# Patient Record
Sex: Male | Born: 1980 | Race: White | Hispanic: Yes | Smoking: Former smoker
Health system: Southern US, Community
[De-identification: ages and names within clinical notes are randomized; demographics above are authoritative.]

## PROBLEM LIST (undated history)

## (undated) DIAGNOSIS — R519 Headache, unspecified: Secondary | ICD-10-CM

## (undated) DIAGNOSIS — F32A Depression, unspecified: Secondary | ICD-10-CM

## (undated) DIAGNOSIS — K219 Gastro-esophageal reflux disease without esophagitis: Secondary | ICD-10-CM

## (undated) DIAGNOSIS — R030 Elevated blood-pressure reading, without diagnosis of hypertension: Secondary | ICD-10-CM

## (undated) DIAGNOSIS — E559 Vitamin D deficiency, unspecified: Secondary | ICD-10-CM

## (undated) DIAGNOSIS — G8929 Other chronic pain: Secondary | ICD-10-CM

## (undated) DIAGNOSIS — F329 Major depressive disorder, single episode, unspecified: Secondary | ICD-10-CM

## (undated) DIAGNOSIS — R200 Anesthesia of skin: Secondary | ICD-10-CM

## (undated) DIAGNOSIS — F419 Anxiety disorder, unspecified: Secondary | ICD-10-CM

## (undated) DIAGNOSIS — E785 Hyperlipidemia, unspecified: Secondary | ICD-10-CM

## (undated) DIAGNOSIS — T7840XA Allergy, unspecified, initial encounter: Secondary | ICD-10-CM

## (undated) DIAGNOSIS — K8020 Calculus of gallbladder without cholecystitis without obstruction: Secondary | ICD-10-CM

## (undated) DIAGNOSIS — G4730 Sleep apnea, unspecified: Secondary | ICD-10-CM

## (undated) HISTORY — PX: EYE SURGERY: SHX253

## (undated) HISTORY — DX: Hyperlipidemia, unspecified: E78.5

## (undated) HISTORY — DX: Sleep apnea, unspecified: G47.30

## (undated) HISTORY — DX: Headache, unspecified: R51.9

## (undated) HISTORY — DX: Depression, unspecified: F32.A

## (undated) HISTORY — DX: Gastro-esophageal reflux disease without esophagitis: K21.9

## (undated) HISTORY — DX: Other chronic pain: G89.29

## (undated) HISTORY — PX: HERNIA REPAIR: SHX51

## (undated) HISTORY — DX: Allergy, unspecified, initial encounter: T78.40XA

## (undated) HISTORY — PX: CHOLECYSTECTOMY: SHX55

## (undated) HISTORY — DX: Anxiety disorder, unspecified: F41.9

## (undated) HISTORY — DX: Calculus of gallbladder without cholecystitis without obstruction: K80.20

---
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
## 2001-07-04 ENCOUNTER — Emergency Department (HOSPITAL_COMMUNITY): Admission: EM | Admit: 2001-07-04 | Discharge: 2001-07-04 | Payer: Self-pay | Admitting: *Deleted

## 2006-06-05 ENCOUNTER — Emergency Department (HOSPITAL_COMMUNITY): Admission: EM | Admit: 2006-06-05 | Discharge: 2006-06-05 | Payer: Self-pay | Admitting: Emergency Medicine

## 2007-04-03 ENCOUNTER — Emergency Department (HOSPITAL_COMMUNITY): Admission: EM | Admit: 2007-04-03 | Discharge: 2007-04-03 | Payer: Self-pay | Admitting: Emergency Medicine

## 2007-06-25 ENCOUNTER — Ambulatory Visit: Payer: Self-pay | Admitting: Ophthalmology

## 2007-06-25 ENCOUNTER — Emergency Department: Payer: Self-pay

## 2007-12-31 ENCOUNTER — Emergency Department (HOSPITAL_COMMUNITY): Admission: EM | Admit: 2007-12-31 | Discharge: 2007-12-31 | Payer: Self-pay | Admitting: Emergency Medicine

## 2008-02-11 ENCOUNTER — Emergency Department (HOSPITAL_COMMUNITY): Admission: EM | Admit: 2008-02-11 | Discharge: 2008-02-11 | Payer: Self-pay | Admitting: Emergency Medicine

## 2008-02-14 ENCOUNTER — Emergency Department (HOSPITAL_COMMUNITY): Admission: EM | Admit: 2008-02-14 | Discharge: 2008-02-14 | Payer: Self-pay | Admitting: Emergency Medicine

## 2008-09-24 ENCOUNTER — Emergency Department (HOSPITAL_COMMUNITY): Admission: EM | Admit: 2008-09-24 | Discharge: 2008-09-25 | Payer: Self-pay | Admitting: Emergency Medicine

## 2009-02-24 ENCOUNTER — Emergency Department (HOSPITAL_COMMUNITY): Admission: EM | Admit: 2009-02-24 | Discharge: 2009-02-25 | Payer: Self-pay | Admitting: Emergency Medicine

## 2010-04-08 IMAGING — CR DG CHEST 2V
2 series · 2 of 2 positions shown · non-contrast
Comparison: Portable chest at 3988 hours

CLINICAL DATA: Left chest pain - question abnormal density at the
left lung base on earlier portable chest

CHEST - 2 VIEW

[w chest pa]
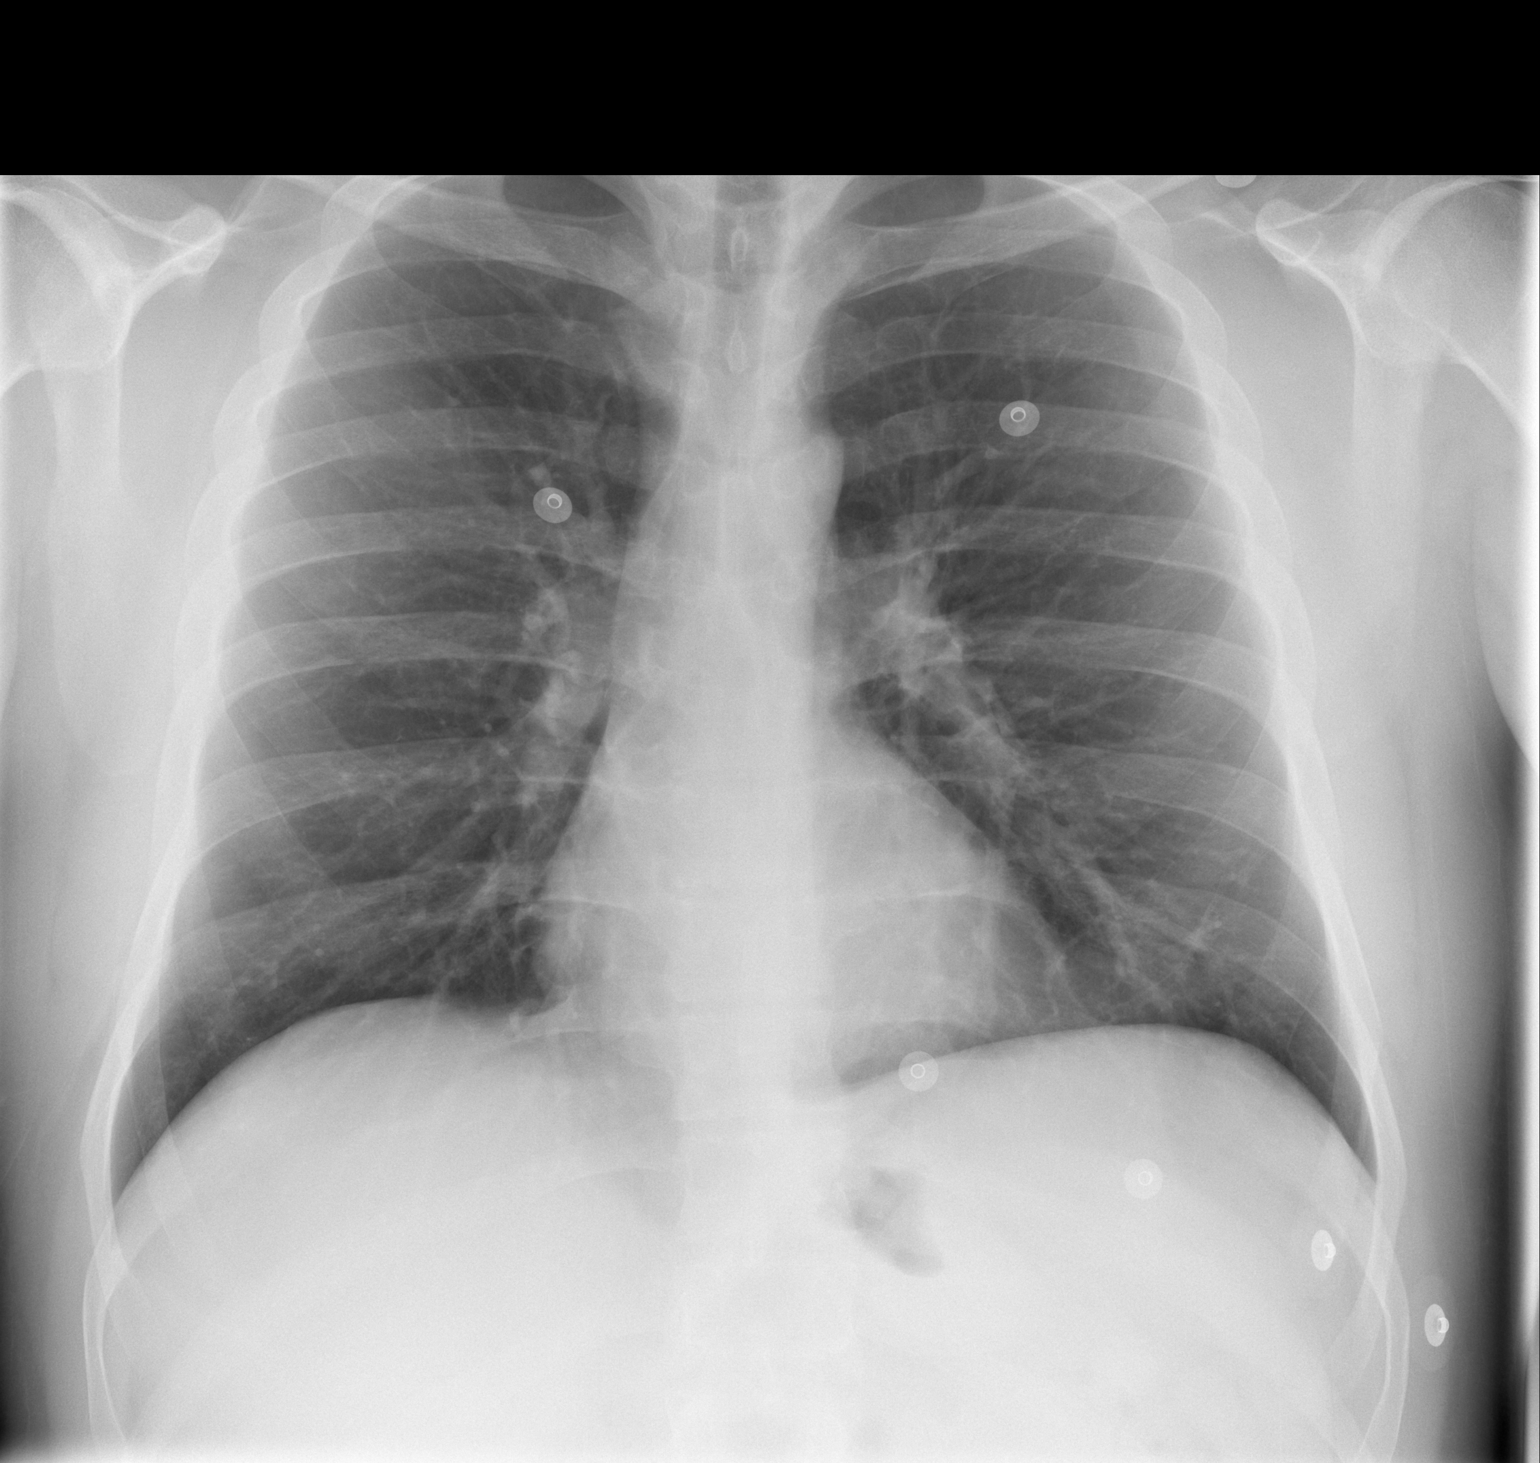

[w chest lat]
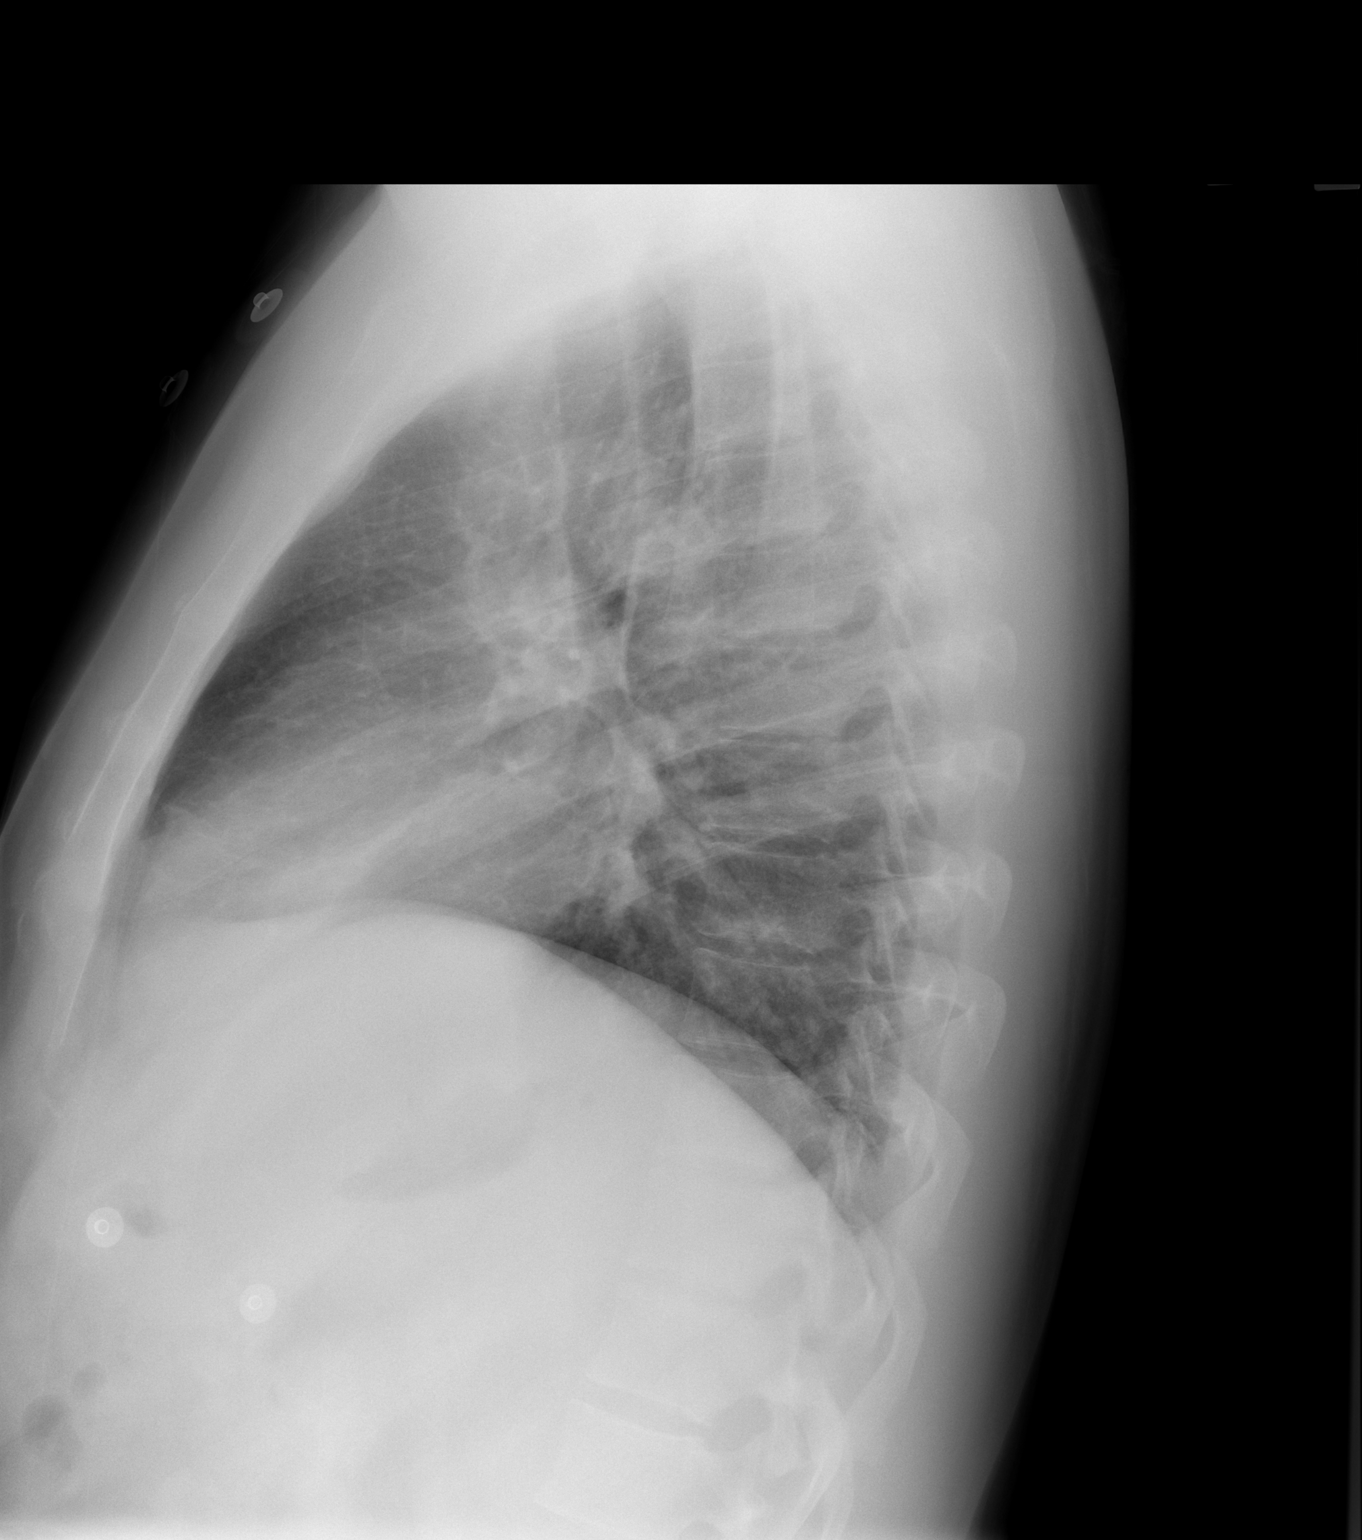

[2 of 2 positions shown; findings below may reference images not displayed]

FINDINGS: Heart and mediastinal contours normal.  Lungs clear.
Specifically no evidence for left lower lobe density as was
questioned on the earlier portable.  No pleural fluid.  Osseous
structures intact.
IMPRESSION: 1.  No left lower lobe density has questioned on the earlier
portable chest.
2.  No active disease

## 2010-04-08 IMAGING — CR DG CHEST 1V PORT
1 series · 1 of 1 positions shown · non-contrast
Comparison: None

CLINICAL DATA: Left chest pain

PORTABLE CHEST - 1 VIEW

[AP]
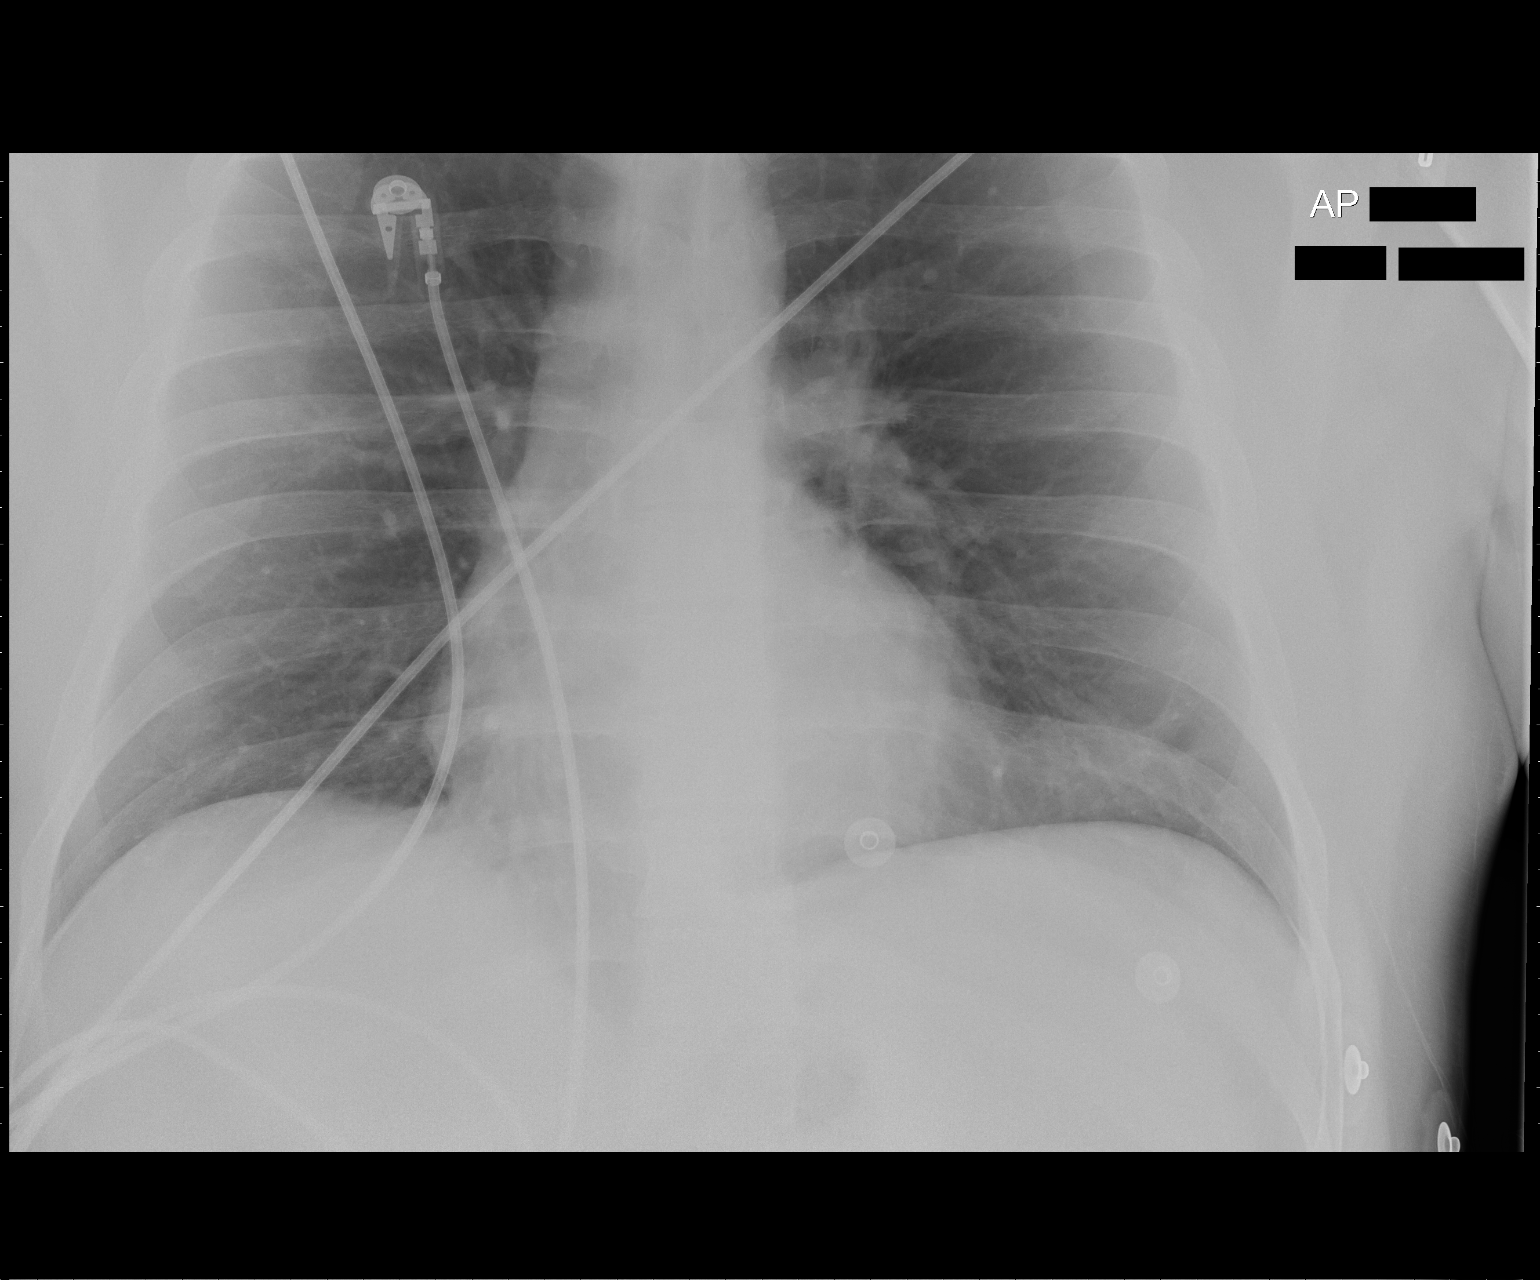

[1 of 1 positions shown; findings below may reference images not displayed]

FINDINGS: Heart vascularity normal.  Possible subtle density at the
left lung base.  Recommend PA and lateral for further assessment.
IMPRESSION: 1.  Cannot exclude a subtle left lower lobe density - recommend PA
and lateral when possible.
2.  No other acute or significant findings.

## 2010-12-06 LAB — URINALYSIS, ROUTINE W REFLEX MICROSCOPIC
Bilirubin Urine: NEGATIVE
Hgb urine dipstick: NEGATIVE
Protein, ur: NEGATIVE mg/dL
Specific Gravity, Urine: 1.017 (ref 1.005–1.030)
Urobilinogen, UA: 1 mg/dL (ref 0.0–1.0)

## 2010-12-06 LAB — DIFFERENTIAL
Basophils Absolute: 0.1 10*3/uL (ref 0.0–0.1)
Basophils Relative: 1 % (ref 0–1)
Eosinophils Relative: 1 % (ref 0–5)
Monocytes Absolute: 0.8 10*3/uL (ref 0.1–1.0)

## 2010-12-06 LAB — COMPREHENSIVE METABOLIC PANEL
ALT: 31 U/L (ref 0–53)
AST: 29 U/L (ref 0–37)
Albumin: 4.6 g/dL (ref 3.5–5.2)
Alkaline Phosphatase: 75 U/L (ref 39–117)
Chloride: 108 mEq/L (ref 96–112)
GFR calc Af Amer: >60 mL/min (ref >60)
Potassium: 3.7 mEq/L (ref 3.5–5.1)
Total Bilirubin: 0.4 mg/dL (ref 0.3–1.2)

## 2010-12-06 LAB — CBC
Platelets: 205 10*3/uL (ref 150–400)
WBC: 11.6 10*3/uL — ABNORMAL HIGH (ref 4.0–10.5)

## 2010-12-06 LAB — HEMOCCULT GUIAC POC 1CARD (OFFICE): Fecal Occult Bld: POSITIVE

## 2010-12-13 LAB — POCT I-STAT, CHEM 8
Creatinine, Ser: 0.9 mg/dL (ref 0.4–1.5)
Glucose, Bld: 127 mg/dL — ABNORMAL HIGH (ref 70–99)
Hemoglobin: 17.3 g/dL — ABNORMAL HIGH (ref 13.0–17.0)
Potassium: 3.7 mEq/L (ref 3.5–5.1)

## 2011-05-26 LAB — COMPREHENSIVE METABOLIC PANEL
BUN: 11
Calcium: 9.6
Creatinine, Ser: 1.02
Glucose, Bld: 102 — ABNORMAL HIGH
Sodium: 141
Total Protein: 7.2

## 2011-05-26 LAB — CBC
HCT: 46.2
Hemoglobin: 16
MCHC: 34.6
MCV: 96.7
RDW: 12

## 2011-05-26 LAB — DIFFERENTIAL
Lymphocytes Relative: 29
Lymphs Abs: 2.9
Monocytes Relative: 5
Neutro Abs: 6.5
Neutrophils Relative %: 64

## 2011-05-26 LAB — LIPASE, BLOOD: Lipase: 19

## 2013-12-27 ENCOUNTER — Ambulatory Visit: Payer: Self-pay | Admitting: Emergency Medicine

## 2013-12-27 VITALS — BP 126/96 | HR 72 | Temp 98.5°F | Resp 12 | Ht 66.25 in | Wt 212.0 lb

## 2013-12-27 DIAGNOSIS — M25469 Effusion, unspecified knee: Secondary | ICD-10-CM

## 2013-12-27 DIAGNOSIS — M25569 Pain in unspecified knee: Secondary | ICD-10-CM

## 2013-12-27 DIAGNOSIS — M25561 Pain in right knee: Secondary | ICD-10-CM

## 2013-12-27 DIAGNOSIS — M25461 Effusion, right knee: Secondary | ICD-10-CM

## 2013-12-27 LAB — POCT CBC
GRANULOCYTE PERCENT: 64.3 % (ref 37–80)
HEMATOCRIT: 46.6 % (ref 43.5–53.7)
HEMOGLOBIN: 15.6 g/dL (ref 14.1–18.1)
Lymph, poc: 2.9 (ref 0.6–3.4)
MCH, POC: 33.7 pg — AB (ref 27–31.2)
MCHC: 33.5 g/dL (ref 31.8–35.4)
MCV: 100.7 fL — AB (ref 80–97)
MID (cbc): 0.6 (ref 0–0.9)
MPV: 9.4 fL (ref 0–99.8)
POC GRANULOCYTE: 6.2 (ref 2–6.9)
POC LYMPH %: 29.6 % (ref 10–50)
POC MID %: 6.1 %M (ref 0–12)
Platelet Count, POC: 290 10*3/uL (ref 142–424)
RBC: 4.63 M/uL — AB (ref 4.69–6.13)
RDW, POC: 12.6 %
WBC: 9.7 10*3/uL (ref 4.6–10.2)

## 2013-12-27 LAB — URIC ACID: URIC ACID, SERUM: 3.6 mg/dL — AB (ref 4.0–7.8)

## 2013-12-27 LAB — GLUCOSE, POCT (MANUAL RESULT ENTRY): POC GLUCOSE: 78 mg/dL (ref 70–99)

## 2013-12-27 MED ORDER — CEFTRIAXONE SODIUM 1 G IJ SOLR
1.0000 g | Freq: Once | INTRAMUSCULAR | Status: AC
  Administered 2013-12-27: 1 g via INTRAMUSCULAR

## 2013-12-27 MED ORDER — DOXYCYCLINE HYCLATE 50 MG PO CAPS: 50.0000 mg | ORAL_CAPSULE | Freq: Two times a day (BID) | ORAL | Status: DC

## 2013-12-27 NOTE — Progress Notes (Signed)
   Subjective:    Patient ID: Scott Best, male    DOB: 12-07-1980, 33 y.o.   MRN: [id number]  HPI 33 year old male pt presents for right knee pain. The pain started on Sunday. He had swelling from knee to mid calf. He works in [employer]. He was putting in wood flooring on Sunday. He noticed the pain after he had been on his knees all day at work. It is red on the medial and lateral side of his right knee. It started turning red yesterday. No hx of medical problems. He had some chills last night. No hx of gout. He has had previous problems with swelling in his right knee due to a cartilage problem. The redness had gone down to his ankle, but now it is primarily around his knee.    Review of Systems     Objective:   Physical Exam there is significant redness and tenderness over the patellar tendon and just to the right of the patellar tendon. The area of redness is approximately 4 x 8". There appears to be a pointing area that is 1 x 1 cm. He does have the ability to flex extend the knee without discomfort  Results for orders placed in visit on 12/27/13  POCT CBC      Result Value Ref Range   WBC 9.7  4.6 - 10.2 K/uL   Lymph, poc 2.9  0.6 - 3.4   POC LYMPH PERCENT 29.6  10 - 50 %L   MID (cbc) 0.6  0 - 0.9   POC MID % 6.1  0 - 12 %M   POC Granulocyte 6.2  2 - 6.9   Granulocyte percent 64.3  37 - 80 %G   RBC 4.63 (*) 4.69 - 6.13 M/uL   Hemoglobin 15.6  14.1 - 18.1 g/dL   HCT, POC 40.946.6  81.143.5 - 53.7 %   MCV 100.7 (*) 80 - 97 fL   MCH, POC 33.7 (*) 27 - 31.2 pg   MCHC 33.5  31.8 - 35.4 g/dL   RDW, POC 91.412.6     Platelet Count, POC 290  142 - 424 K/uL   MPV 9.4  0 - 99.8 fL  GLUCOSE, POCT (MANUAL RESULT ENTRY)      Result Value Ref Range   POC Glucose 78  70 - 99 mg/dl        Assessment & Plan:  Patient to continue ibuprofen. He will be given 1 g of Rocephin and started on doxycycline recheck in the morning.. uric acid drawn

## 2013-12-27 NOTE — Patient Instructions (Signed)
Cellulitis Cellulitis is an infection of the skin and the tissue beneath it. The infected area is usually red and tender. Cellulitis occurs most often in the arms and lower legs.  CAUSES  Cellulitis is caused by bacteria that enter the skin through cracks or cuts in the skin. The most common types of bacteria that cause cellulitis are Staphylococcus and Streptococcus. SYMPTOMS   Redness and warmth.  Swelling.  Tenderness or pain.  Fever. DIAGNOSIS  Your caregiver can usually determine what is wrong based on a physical exam. Blood tests may also be done. TREATMENT  Treatment usually involves taking an antibiotic medicine. HOME CARE INSTRUCTIONS   Take your antibiotics as directed. Finish them even if you start to feel better.  Keep the infected arm or leg elevated to reduce swelling.  Apply a warm cloth to the affected area up to 4 times per day to relieve pain.  Only take over-the-counter or prescription medicines for pain, discomfort, or fever as directed by your caregiver.  Keep all follow-up appointments as directed by your caregiver. SEEK MEDICAL CARE IF:   You notice red streaks coming from the infected area.  Your red area gets larger or turns dark in color.  Your bone or joint underneath the infected area becomes painful after the skin has healed.  Your infection returns in the same area or another area.  You notice a swollen bump in the infected area.  You develop new symptoms. SEEK IMMEDIATE MEDICAL CARE IF:   You have a fever.  You feel very sleepy.  You develop vomiting or diarrhea.  You have a general ill feeling (malaise) with muscle aches and pains. MAKE SURE YOU:   Understand these instructions.  Will watch your condition.  Will get help right away if you are not doing well or get worse. Document Released: 05/25/2005 Document Revised: 02/14/2012 Document Reviewed: 10/31/2011 [employer] Patient Information 2014 [employer].  

## 2013-12-28 ENCOUNTER — Ambulatory Visit: Payer: Self-pay | Admitting: Emergency Medicine

## 2013-12-28 VITALS — BP 94/76 | HR 63 | Temp 97.8°F | Resp 16 | Ht 67.75 in | Wt 215.0 lb

## 2013-12-28 DIAGNOSIS — L089 Local infection of the skin and subcutaneous tissue, unspecified: Secondary | ICD-10-CM

## 2013-12-28 MED ORDER — CEFTRIAXONE SODIUM 1 G IJ SOLR
1.0000 g | Freq: Once | INTRAMUSCULAR | Status: AC
Start: 2013-12-28 — End: 2013-12-28
  Administered 2013-12-28: 1 g via INTRAMUSCULAR

## 2013-12-28 MED ORDER — DOXYCYCLINE HYCLATE 50 MG PO CAPS: 50.0000 mg | ORAL_CAPSULE | Freq: Two times a day (BID) | ORAL | Status: DC

## 2013-12-28 NOTE — Progress Notes (Signed)
   Subjective:    Patient ID: Scott Best, male    DOB: [birth year], 33 y.o.   MRN: [id number]  HPI [id number] year old male pt presents for re-check of right knee. The pain has gone down. The redness has faded from yesterday. No chills or fever. It is not as warm as yesterday.    Review of Systems     Objective:   Physical Exam there is decreased redness over the lateral right knee. There is one focal area 2 x 2 centimeters which is extremely tender.        Assessment & Plan:  Patient has a cellulitis bursitis over the lateral right knee. He does seem improved after a shot of Rocephin and taking doxycycline. Will give another 1 g of Rocephin.

## 2013-12-28 NOTE — Patient Instructions (Signed)
Cellulitis Cellulitis is an infection of the skin and the tissue beneath it. The infected area is usually red and tender. Cellulitis occurs most often in the arms and lower legs.  CAUSES  Cellulitis is caused by bacteria that enter the skin through cracks or cuts in the skin. The most common types of bacteria that cause cellulitis are Staphylococcus and Streptococcus. SYMPTOMS   Redness and warmth.  Swelling.  Tenderness or pain.  Fever. DIAGNOSIS  Your caregiver can usually determine what is wrong based on a physical exam. Blood tests may also be done. TREATMENT  Treatment usually involves taking an antibiotic medicine. HOME CARE INSTRUCTIONS   Take your antibiotics as directed. Finish them even if you start to feel better.  Keep the infected arm or leg elevated to reduce swelling.  Apply a warm cloth to the affected area up to 4 times per day to relieve pain.  Only take over-the-counter or prescription medicines for pain, discomfort, or fever as directed by your caregiver.  Keep all follow-up appointments as directed by your caregiver. SEEK MEDICAL CARE IF:   You notice red streaks coming from the infected area.  Your red area gets larger or turns dark in color.  Your bone or joint underneath the infected area becomes painful after the skin has healed.  Your infection returns in the same area or another area.  You notice a swollen bump in the infected area.  You develop new symptoms. SEEK IMMEDIATE MEDICAL CARE IF:   You have a fever.  You feel very sleepy.  You develop vomiting or diarrhea.  You have a general ill feeling (malaise) with muscle aches and pains. MAKE SURE YOU:   Understand these instructions.  Will watch your condition.  Will get help right away if you are not doing well or get worse. Document Released: 05/25/2005 Document Revised: 02/14/2012 Document Reviewed: 10/31/2011 [employer] Patient Information 2014 [employer].  

## 2014-01-01 ENCOUNTER — Telehealth: Payer: Self-pay

## 2014-01-01 NOTE — Telephone Encounter (Signed)
MS. JARRETT STATES PT HAD A WOUND AND THEY JUST WANT TO SPEAK WITH SOMEONE ABOUT THE WAY IT IS HEALING PLEASE CALL [phone]

## 2014-01-01 NOTE — Telephone Encounter (Signed)
Spoke with Ms. Scott Best, she says that the wound is looking much better. She was worried about one area that was redder earlier but looks ok now. Gave precautions to RTC if ir got bigger/redder/hotter or started having pus again. She understood

## 2014-01-26 ENCOUNTER — Telehealth: Payer: Self-pay | Admitting: Family Medicine

## 2014-01-26 ENCOUNTER — Ambulatory Visit: Payer: Self-pay | Admitting: Family Medicine

## 2014-01-26 VITALS — BP 128/88 | HR 87 | Temp 98.3°F | Resp 14 | Ht 67.75 in | Wt 217.4 lb

## 2014-01-26 DIAGNOSIS — R21 Rash and other nonspecific skin eruption: Secondary | ICD-10-CM

## 2014-01-26 MED ORDER — PREDNISONE 20 MG PO TABS: ORAL_TABLET | ORAL | Status: DC

## 2014-01-26 MED ORDER — DOXYCYCLINE HYCLATE 100 MG PO TABS: 100.0000 mg | ORAL_TABLET | Freq: Two times a day (BID) | ORAL | Status: DC

## 2014-01-26 NOTE — Progress Notes (Signed)
Chief Complaint:  Chief Complaint  Patient presents with  . Rash    rash all over body x 3-4 days. pt reports having taken allegra and putting hydrocortisone cream on it. Suspects poison ivy    HPI: Scott Best is a 33 y.o. male who is here for  3-4 day history of diffuse rash on bilateral arms primarily and some spots on abdomen and chest and also legs and groins. He does not have any rashes on hands. He has itching during the daytime,  in the afternoon the worst. He has tried Careers adviser and otc hyrdocortisone cream. He did use a new soap once about 1  week ago, has not had any new travels or food. He did have cellulitis on 5/1 and was put on 10 days of doxycycline for cellulitis and tolerated it well. He denies fevers or chills. He does have a dog, deneis ticks/bedbugs, scabies. 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   Past Medical History  Diagnosis Date  . Allergy   . Anxiety    Past Surgical History  Procedure Laterality Date  . Cholecystectomy    . Hernia repair    . Eye surgery      R eye repair nail injury   History   Social History  . Marital Status: Single    Spouse Name: N/A    Number of Children: N/A  . Years of Education: N/A   Social History Main Topics  . Smoking status: Former Games developer  . Smokeless tobacco: None  . Alcohol Use: 2.4 oz/week    4 Cans of beer per week  . Drug Use: No  . Sexual Activity: None   Other Topics Concern  . None   Social History Narrative  . None   Family History  Problem Relation Age of Onset  . Diabetes Mother   . Heart disease Father   . Cancer Maternal Grandfather   . Cancer Paternal Grandmother    No Known Allergies Prior to Admission medications   Medication Sig Start Date End Date Taking? Authorizing Provider  fexofenadine (ALLEGRA) 60 MG tablet Take 60 mg by mouth daily.   Yes Historical Provider, MD  omeprazole (PRILOSEC) 20 MG capsule Take 20 mg by mouth daily.   Yes  Historical Provider, MD  doxycycline (VIBRAMYCIN) 50 MG capsule Take 1 capsule (50 mg total) by mouth 2 (two) times daily. 12/28/13   Collene Gobble, MD     ROS: The patient denies fevers, chills, night sweats, unintentional weight loss, chest pain, palpitations, wheezing, dyspnea on exertion, nausea, vomiting, abdominal pain, dysuria, hematuria, melena, numbness, weakness, or tingling.   All other systems have been reviewed and were otherwise negative with the exception of those mentioned in the HPI and as above.    PHYSICAL EXAM: Filed Vitals:   01/26/14 1125  BP: 128/88  Pulse: 87  Temp: 98.3 F (36.8 C)  Resp: 14   Filed Vitals:   01/26/14 1125  Height: 5' 7.75" (1.721 m)  Weight: 217 lb 6.4 oz (98.612 kg)   Body mass index is 33.29 kg/(m^2).  General: Alert, no acute distress HEENT:  Normocephalic, atraumatic, oropharynx patent. EOMI, PERRLA Cardiovascular:  Regular rate and rhythm, no rubs murmurs or gallops.  No Carotid bruits, radial pulse intact. No pedal edema.  Respiratory: Clear to auscultation bilaterally.  No wheezes, rales, or rhonchi.  No cyanosis, no use of accessory musculature GI: No organomegaly, abdomen is soft  and non-tender, positive bowel sounds.  No masses. Skin: + rashes. IT appears to be 2 diff types of rash. He has a contact dermatitis rash  Maculopapular with some excoriated papules on bialteral arms, abd , groin area. THere are rows of bites  In 1-2 areas.  THen he has a healing cellulitic area on left out ankle, sital lower extremity.  He deos not have any lesions on hands or inside webspaces.  Neurologic: Facial musculature symmetric. Psychiatric: Patient is appropriate throughout our interaction. Lymphatic: No cervical lymphadenopathy Musculoskeletal: Gait intact.   LABS: Results for orders placed in visit on 12/27/13  URIC ACID      Result Value Ref Range   Uric Acid, Serum 3.6 (*) 4.0 - 7.8 mg/dL  POCT CBC      Result Value Ref Range   WBC  9.7  4.6 - 10.2 K/uL   Lymph, poc 2.9  0.6 - 3.4   POC LYMPH PERCENT 29.6  10 - 50 %L   MID (cbc) 0.6  0 - 0.9   POC MID % 6.1  0 - 12 %M   POC Granulocyte 6.2  2 - 6.9   Granulocyte percent 64.3  37 - 80 %G   RBC 4.63 (*) 4.69 - 6.13 M/uL   Hemoglobin 15.6  14.1 - 18.1 g/dL   HCT, POC 60.446.6  54.043.5 - 53.7 %   MCV 100.7 (*) 80 - 97 fL   MCH, POC 33.7 (*) 27 - 31.2 pg   MCHC 33.5  31.8 - 35.4 g/dL   RDW, POC 98.112.6     Platelet Count, POC 290  142 - 424 K/uL   MPV 9.4  0 - 99.8 fL  GLUCOSE, POCT (MANUAL RESULT ENTRY)      Result Value Ref Range   POC Glucose 78  70 - 99 mg/dl     EKG/XRAY:   [facility]   ASSESSMENT/PLAN: Encounter Diagnosis  Name Primary?  . Rash and nonspecific skin eruption Yes   Contact dermatitis  ?  Advise to check dog for ticks and also bed bugs Rx prednisone, cw allegra or zyrtec daily Soap and water for wound care Rx doxy prn if excoriated areas where he scratched start opening then he can take it, but hold for now.   Gross sideeffects, risk and benefits, and alternatives of medications d/w patient. Patient is aware that all medications have potential sideeffects and we are unable to predict every sideeffect or drug-drug interaction that may occur.  Lenell Antuhao P Le, DO 01/26/2014 2:01 PM

## 2014-01-27 NOTE — Telephone Encounter (Signed)
error 

## 2014-02-04 ENCOUNTER — Telehealth: Payer: Self-pay

## 2014-02-04 NOTE — Telephone Encounter (Signed)
Girlfriend is not on HIPPA. Spoke to pt- he did not take the final 3 doses of prednisone. He didn't feel well those days but is fine today. Pt reports his rash is cleared up. Advised pt to RTC if he needs anything. Pt was talkative, polite, alert and oriented. No signs that pt was in any state of rage. Asked pt if he felt that he was upset or depressed. He states he is doing ok. Pt states he is not in danger of hurting himself or anyone else.

## 2014-02-04 NOTE — Telephone Encounter (Signed)
Patients girlfriend called stating that Abhijay suddenly stopped taking prednisone. She states that patient is now in a rage and feels that it is a reaction to the sudden stop of the medication.  Advised to have patient come in to be reevaluated and she declined.

## 2014-08-11 ENCOUNTER — Ambulatory Visit (INDEPENDENT_AMBULATORY_CARE_PROVIDER_SITE_OTHER): Payer: Self-pay | Admitting: Urgent Care

## 2014-08-11 VITALS — BP 126/82 | HR 88 | Temp 98.9°F | Resp 18 | Ht 67.75 in | Wt 220.2 lb

## 2014-08-11 DIAGNOSIS — J988 Other specified respiratory disorders: Secondary | ICD-10-CM

## 2014-08-11 DIAGNOSIS — J029 Acute pharyngitis, unspecified: Secondary | ICD-10-CM

## 2014-08-11 DIAGNOSIS — R0981 Nasal congestion: Secondary | ICD-10-CM

## 2014-08-11 LAB — POCT RAPID STREP A (OFFICE): RAPID STREP A SCREEN: NEGATIVE

## 2014-08-11 MED ORDER — FLUTICASONE PROPIONATE 50 MCG/ACT NA SUSP: 2.0000 | Freq: Every day | NASAL | Status: DC

## 2014-08-11 MED ORDER — HYDROCODONE-HOMATROPINE 5-1.5 MG/5ML PO SYRP: 5.0000 mL | ORAL_SOLUTION | Freq: Every evening | ORAL | Status: DC | PRN

## 2014-08-11 MED ORDER — GUAIFENESIN ER 1200 MG PO TB12: 1.0000 | ORAL_TABLET | Freq: Two times a day (BID) | ORAL | Status: DC | PRN

## 2014-08-11 MED ORDER — IPRATROPIUM BROMIDE 0.03 % NA SOLN: 2.0000 | Freq: Two times a day (BID) | NASAL | Status: DC

## 2014-08-11 NOTE — Patient Instructions (Signed)
- You may use 3-4 ibuprofen over-the-counter for your sore throat and myalgias. - Once your symptoms are under control, please stop the Atrovent nasal spray (you may resume it as needed), but continue the Flonase nasal spray (at least for the duration of your allergy season).  - For appropriate administration of nasal spray, clear the nose, use opposite hand for opposite nare, sniff gently, exhale through your mouth and remember to take these two sprays at least 30 minutes apart. - Drink at least 64 ounces of water each day. - Remove as many irritants/allergies as you are able to and wear a mask while working with insulation or around other respiratory irritants.    Upper Respiratory Infection, Adult An upper respiratory infection (URI) is also sometimes known as the common cold. The upper respiratory tract includes the nose, sinuses, throat, trachea, and bronchi. Bronchi are the airways leading to the lungs. Most people improve within 1 week, but symptoms can last up to 2 weeks. A residual cough may last even longer.  CAUSES Many different viruses can infect the tissues lining the upper respiratory tract. The tissues become irritated and inflamed and often become very moist. Mucus production is also common. A cold is contagious. You can easily spread the virus to others by oral contact. This includes kissing, sharing a glass, coughing, or sneezing. Touching your mouth or nose and then touching a surface, which is then touched by another person, can also spread the virus. SYMPTOMS  Symptoms typically develop 1 to 3 days after you come in contact with a cold virus. Symptoms vary from person to person. They may include:  Runny nose.  Sneezing.  Nasal congestion.  Sinus irritation.  Sore throat.  Loss of voice (laryngitis).  Cough.  Fatigue.  Muscle aches.  Loss of appetite.  Headache.  Low-grade fever. DIAGNOSIS  You might diagnose your own cold based on familiar symptoms, since  most people get a cold 2 to 3 times a year. Your caregiver can confirm this based on your exam. Most importantly, your caregiver can check that your symptoms are not due to another disease such as strep throat, sinusitis, pneumonia, asthma, or epiglottitis. Blood tests, throat tests, and X-rays are not necessary to diagnose a common cold, but they may sometimes be helpful in excluding other more serious diseases. Your caregiver will decide if any further tests are required. RISKS AND COMPLICATIONS  You may be at risk for a more severe case of the common cold if you smoke cigarettes, have chronic heart disease (such as heart failure) or lung disease (such as asthma), or if you have a weakened immune system. The very young and very old are also at risk for more serious infections. Bacterial sinusitis, middle ear infections, and bacterial pneumonia can complicate the common cold. The common cold can worsen asthma and chronic obstructive pulmonary disease (COPD). Sometimes, these complications can require emergency medical care and may be life-threatening. PREVENTION  The best way to protect against getting a cold is to practice good hygiene. Avoid oral or hand contact with people with cold symptoms. Wash your hands often if contact occurs. There is no clear evidence that vitamin C, vitamin E, echinacea, or exercise reduces the chance of developing a cold. However, it is always recommended to get plenty of rest and practice good nutrition. TREATMENT  Treatment is directed at relieving symptoms. There is no cure. Antibiotics are not effective, because the infection is caused by a virus, not by bacteria. Treatment may include:  Increased fluid intake. Sports drinks offer valuable electrolytes, sugars, and fluids.  Breathing heated mist or steam (vaporizer or shower).  Eating chicken soup or other clear broths, and maintaining good nutrition.  Getting plenty of rest.  Using gargles or lozenges for  comfort.  Controlling fevers with ibuprofen or acetaminophen as directed by your caregiver.  Increasing usage of your inhaler if you have asthma. Zinc gel and zinc lozenges, taken in the first 24 hours of the common cold, can shorten the duration and lessen the severity of symptoms. Pain medicines may help with fever, muscle aches, and throat pain. A variety of non-prescription medicines are available to treat congestion and runny nose. Your caregiver can make recommendations and may suggest nasal or lung inhalers for other symptoms.  HOME CARE INSTRUCTIONS   Only take over-the-counter or prescription medicines for pain, discomfort, or fever as directed by your caregiver.  Use a warm mist humidifier or inhale steam from a shower to increase air moisture. This may keep secretions moist and make it easier to breathe.  Drink enough water and fluids to keep your urine clear or pale yellow.  Rest as needed.  Return to work when your temperature has returned to normal or as your caregiver advises. You may need to stay home longer to avoid infecting others. You can also use a face mask and careful hand washing to prevent spread of the virus. SEEK MEDICAL CARE IF:   After the first few days, you feel you are getting worse rather than better.  You need your caregiver's advice about medicines to control symptoms.  You develop chills, worsening shortness of breath, or brown or red sputum. These may be signs of pneumonia.  You develop yellow or brown nasal discharge or pain in the face, especially when you bend forward. These may be signs of sinusitis.  You develop a fever, swollen neck glands, pain with swallowing, or white areas in the back of your throat. These may be signs of strep throat. SEEK IMMEDIATE MEDICAL CARE IF:   You have a fever.  You develop severe or persistent headache, ear pain, sinus pain, or chest pain.  You develop wheezing, a prolonged cough, cough up blood, or have a  change in your usual mucus (if you have chronic lung disease).  You develop sore muscles or a stiff neck. Document Released: 02/08/2001 Document Revised: 11/07/2011 Document Reviewed: 11/20/2013 [facility] Patient Information 2015 [place], [place]. This information is not intended to replace advice given to you by your health care provider. Make sure you discuss any questions you have with your health care provider.

## 2014-08-11 NOTE — Progress Notes (Signed)
MRN: [id number] DOB: 1980-11-05  Subjective:   Scott Best is a 33 y.o. male presenting for 3 day history of sore throat, myalgias. Patient has also had difficulty swallowing, congestion, sinus headache, fatigue, upset stomach. Had mild ear fullness and felt feverish yesterday, started having mild cough today. He has used XL-3 x3 over the weekend without any relief. He denies having fevers, chills, n/v, diarrhea, itchy watery or red eyes, ear drainage, ear pain, chest pain, chest tightness, wheezing, shob. Admits history of seasonal allergies, not currently taking any medications. Denies history of asthma. Denies smoking, ~4 beers per week. Denies any other aggravating or relieving factors, no other questions or concerns.  Scott Best has a current medication list which includes the following prescription(s): omeprazole.  He has No Known Allergies.  Scott Best  has a past medical history of Allergy and Anxiety. Also  has past surgical history that includes Cholecystectomy; Hernia repair; and Eye surgery.  ROS As in subjective.  Objective:   Vitals: BP 126/82 mmHg  Pulse 88  Temp(Src) 98.9 F (37.2 C) (Oral)  Resp 18  Ht 5' 7.75" (1.721 m)  Wt 220 lb 3.2 oz (99.882 kg)  BMI 33.72 kg/m2  SpO2 97%  Physical Exam  Constitutional: He is oriented to person, place, and time and well-developed, well-nourished, and in no distress. No distress.  HENT:  Nasal turbinates edematous, clear-yellow rhinorrhea. TM's flat bilaterally but intact, non-erythematous, no drainage. Oropharynx clear and moist, mild-moderate tonsillar edema.  Eyes: Conjunctivae and EOM are normal. Pupils are equal, round, and reactive to light. Right eye exhibits no discharge. Left eye exhibits no discharge. No scleral icterus.  Neck: Normal range of motion. Neck supple. No thyromegaly present.  Cardiovascular: Normal rate, regular rhythm, normal heart sounds and intact distal pulses.  Exam reveals no gallop and no  friction rub.   No murmur heard. Pulmonary/Chest: Effort normal and breath sounds normal. No respiratory distress. He has no wheezes. He has no rales. He exhibits no tenderness.  Abdominal: Soft. Bowel sounds are normal. He exhibits no distension and no mass. There is no tenderness.  No hepatosplenomegaly.  Lymphadenopathy:    He has cervical adenopathy (anterior).  Neurological: He is alert and oriented to person, place, and time.  Skin: Skin is warm and dry. No rash noted. He is not diaphoretic. No erythema.  Psychiatric: Mood and affect normal.   Results for orders placed or performed in visit on 08/11/14 (from the past 24 hour(s))  POCT rapid strep A     Status: None   Collection Time: 08/11/14  9:51 AM  Result Value Ref Range   Rapid Strep A Screen Negative Negative   Assessment and Plan :   1. Respiratory infection 2. Sore throat - Recommended supportive care. Patient to return to clinic if no improvement or worsening symptoms by the end of the week - POCT rapid strep A - Throat culture Scott Best) pending - Will try ibuprofen otc first, then fill Hycodan syrup if no relief  3. Nasal congestion - Guaifenesin (MUCINEX MAXIMUM STRENGTH) 1200 MG TB12; Take 1 tablet (1,200 mg total) by mouth every 12 (twelve) hours as needed.  Dispense: 14 tablet; Refill: 1 - ipratropium (ATROVENT) 0.03 % nasal spray; Place 2 sprays into both nostrils 2 (two) times daily.  Dispense: 30 mL; Refill: 0 - fluticasone (FLONASE) 50 MCG/ACT nasal spray; Place 2 sprays into both nostrils daily.  Dispense: 16 g; Refill: 12   Scott Bamberg, PA-C [facility]  [facility] [phone] 08/11/2014 10:00 AM

## 2014-08-13 ENCOUNTER — Encounter: Payer: Self-pay | Admitting: Urgent Care

## 2014-08-13 LAB — CULTURE, GROUP A STREP: Organism ID, Bacteria: NORMAL

## 2015-03-04 ENCOUNTER — Ambulatory Visit (INDEPENDENT_AMBULATORY_CARE_PROVIDER_SITE_OTHER): Payer: Self-pay | Admitting: Family Medicine

## 2015-03-04 ENCOUNTER — Ambulatory Visit (INDEPENDENT_AMBULATORY_CARE_PROVIDER_SITE_OTHER): Payer: Self-pay

## 2015-03-04 ENCOUNTER — Other Ambulatory Visit: Payer: Self-pay | Admitting: Physician Assistant

## 2015-03-04 VITALS — BP 110/80 | HR 75 | Temp 98.2°F | Resp 18 | Ht 67.75 in | Wt 208.6 lb

## 2015-03-04 DIAGNOSIS — M25562 Pain in left knee: Secondary | ICD-10-CM

## 2015-03-04 DIAGNOSIS — R21 Rash and other nonspecific skin eruption: Secondary | ICD-10-CM

## 2015-03-04 MED ORDER — CEPHALEXIN 500 MG PO CAPS: 500.0000 mg | ORAL_CAPSULE | Freq: Four times a day (QID) | ORAL | Status: DC

## 2015-03-04 NOTE — Patient Instructions (Addendum)
Please follow up with Dr. Clelia CroftShaw at [address] [place] at 5 pm in 48 hours.  If you become worse then return to clinic or go to the hospital.    Cellulitis Cellulitis is an infection of the skin and the tissue beneath it. The infected area is usually red and tender. Cellulitis occurs most often in the arms and lower legs.  CAUSES  Cellulitis is caused by bacteria that enter the skin through cracks or cuts in the skin. The most common types of bacteria that cause cellulitis are staphylococci and streptococci. SIGNS AND SYMPTOMS   Redness and warmth.  Swelling.  Tenderness or pain.  Fever. DIAGNOSIS  Your health care provider can usually determine what is wrong based on a physical exam. Blood tests may also be done. TREATMENT  Treatment usually involves taking an antibiotic medicine. HOME CARE INSTRUCTIONS   Take your antibiotic medicine as directed by your health care provider. Finish the antibiotic even if you start to feel better.  Keep the infected arm or leg elevated to reduce swelling.  Apply a warm cloth to the affected area up to 4 times per day to relieve pain.  Take medicines only as directed by your health care provider.  Keep all follow-up visits as directed by your health care provider. SEEK MEDICAL CARE IF:   You notice red streaks coming from the infected area.  Your red area gets larger or turns dark in color.  Your bone or joint underneath the infected area becomes painful after the skin has healed.  Your infection returns in the same area or another area.  You notice a swollen bump in the infected area.  You develop new symptoms.  You have a fever. SEEK IMMEDIATE MEDICAL CARE IF:   You feel very sleepy.  You develop vomiting or diarrhea.  You have a general ill feeling (malaise) with muscle aches and pains. MAKE SURE YOU:   Understand these instructions.  Will watch your condition.  Will get help right away if you are not doing well or get  worse. Document Released: 05/25/2005 Document Revised: 12/30/2013 Document Reviewed: 10/31/2011 [facility] Patient Information 2015 [place]Care, [place]. This information is not intended to replace advice given to you by your health care provider. Make sure you discuss any questions you have with your health care provider.

## 2015-03-04 NOTE — Progress Notes (Signed)
03/04/2015 at 8:35 PM  Scott Best / DOB: [birth year] / MRN: [id number]  The patient  does not have a problem list on file.  SUBJECTIVE  Chief complaint: Rash  Patient here for left anterolateral leg pain just superior to the fibula.  Describes the pain as a throbbing and severely tender. He denies joint pain.  Denies any injury to the sight. He has had cellulitis in the past and reports his symptoms are similar.  Denies a history of gout.   He  has a past medical history of Allergy and Anxiety.    Medications reviewed and updated by myself where necessary, and exist elsewhere in the encounter.   Scott Best has No Known Allergies. He  reports that he has quit smoking. He does not have any smokeless tobacco history on file. He reports that he drinks about 2.4 oz of alcohol per week. He reports that he does not use illicit drugs. He  has no sexual activity history on file. The patient  has past surgical history that includes Cholecystectomy; Hernia repair; and Eye surgery.  His family history includes Cancer in his maternal grandfather and paternal grandmother; Diabetes in his mother; Heart disease in his father.  Review of Systems  Constitutional: Negative for fever and chills.  Respiratory: Negative for wheezing.   Cardiovascular: Negative for chest pain.  Gastrointestinal: Negative for nausea and vomiting.  Genitourinary: Negative for dysuria.  Musculoskeletal: Negative for myalgias.  Skin: Positive for rash. Negative for itching.  Neurological: Negative for dizziness and headaches.    OBJECTIVE  His  height is 5' 7.75" (1.721 m) and weight is 208 lb 9.6 oz (94.62 kg). His oral temperature is 98.2 F (36.8 C). His blood pressure is 110/80 and his pulse is 75. His respiration is 18 and oxygen saturation is 99%.  The patient's body mass index is 31.95 kg/(m^2).  Physical Exam  Vitals reviewed. Constitutional: He is oriented to person, place, and time.    Cardiovascular: Normal rate and regular rhythm.   Respiratory: Breath sounds normal.  Neurological: He is alert and oriented to person, place, and time.  Skin: Skin is warm.     Psychiatric: He has a normal mood and affect.   [facility] reading (PRIMARY) by  Dr. Clelia Croft: Negative for osseous abnormality.  The soft tissue is unremarkable.   No results found for this or any previous visit (from the past 24 hour(s)).  ASSESSMENT & PLAN  Scott Best was seen today for rash.  Diagnoses and all orders for this visit:  Rash and nonspecific skin eruption: Awaiting CBC and sed rate, however this appears to be an early cellulitis. Will cover with Keflex. He is to follow up with Dr. Clelia Croft in 48 hours.  Orders: -     CBC with Differential/Platelet  Knee pain, left Orders: -     DG Knee Complete 4 Views Left; Future    The patient was advised to call or come back to clinic if he does not see an improvement in symptoms, or worsens with the above plan.   Deliah Boston, MHS, PA-C [facility] 03/04/2015 8:35 PM

## 2015-03-05 ENCOUNTER — Ambulatory Visit (INDEPENDENT_AMBULATORY_CARE_PROVIDER_SITE_OTHER): Payer: Self-pay | Admitting: Emergency Medicine

## 2015-03-05 VITALS — BP 130/90 | HR 87 | Temp 99.3°F | Resp 18 | Ht 67.0 in | Wt 215.4 lb

## 2015-03-05 DIAGNOSIS — L03116 Cellulitis of left lower limb: Secondary | ICD-10-CM

## 2015-03-05 LAB — CBC WITH DIFFERENTIAL/PLATELET
BASOS PCT: 1 % (ref 0–1)
Basophils Absolute: 0.1 10*3/uL (ref 0.0–0.1)
Eosinophils Absolute: 0.1 10*3/uL (ref 0.0–0.7)
Eosinophils Relative: 1 % (ref 0–5)
HCT: 43.4 % (ref 39.0–52.0)
Hemoglobin: 15.2 g/dL (ref 13.0–17.0)
LYMPHS PCT: 28 % (ref 12–46)
Lymphs Abs: 4 10*3/uL (ref 0.7–4.0)
MCH: 33.5 pg (ref 26.0–34.0)
MCHC: 35 g/dL (ref 30.0–36.0)
MCV: 95.6 fL (ref 78.0–100.0)
MONO ABS: 0.9 10*3/uL (ref 0.1–1.0)
MONOS PCT: 6 % (ref 3–12)
MPV: 11.4 fL (ref 8.6–12.4)
NEUTROS ABS: 9.1 10*3/uL — AB (ref 1.7–7.7)
Neutrophils Relative %: 64 % (ref 43–77)
Platelets: 226 10*3/uL (ref 150–400)
RBC: 4.54 MIL/uL (ref 4.22–5.81)
RDW: 13.2 % (ref 11.5–15.5)
WBC: 14.2 10*3/uL — ABNORMAL HIGH (ref 4.0–10.5)

## 2015-03-05 LAB — SEDIMENTATION RATE: SED RATE: 4 mm/h (ref 0–15)

## 2015-03-05 MED ORDER — SULFAMETHOXAZOLE-TRIMETHOPRIM 800-160 MG PO TABS: 2.0000 | ORAL_TABLET | Freq: Two times a day (BID) | ORAL | Status: DC

## 2015-03-05 NOTE — Progress Notes (Signed)
Subjective:  Patient ID: Scott Best, male    DOB: 1980/09/07  Age: 34 y.o. MRN: [id number]  CC: Follow-up   HPI Scott Best presents  For follow-up after his visit yesterday he was put on Keflex for cellulitis and knee. He is now complaining of fever and chills malaise and fatigue. Is increased swelling of his lower extremity. Denies any abscess or drainage  History Ozzie has a past medical history of Allergy and Anxiety.   He has past surgical history that includes Cholecystectomy; Hernia repair; and Eye surgery.   His  family history includes Cancer in his maternal grandfather and paternal grandmother; Diabetes in his mother; Heart disease in his father.  He   reports that he has quit smoking. He does not have any smokeless tobacco history on file. He reports that he drinks about 2.4 oz of alcohol per week. He reports that he does not use illicit drugs.  Outpatient Prescriptions Prior to Visit  Medication Sig Dispense Refill  . cephALEXin (KEFLEX) 500 MG capsule Take 1 capsule (500 mg total) by mouth 4 (four) times daily. 40 capsule 0  . fluticasone (FLONASE) 50 MCG/ACT nasal spray Place 2 sprays into both nostrils daily. (Patient not taking: Reported on 03/04/2015) 16 g 12  . Guaifenesin (MUCINEX MAXIMUM STRENGTH) 1200 MG TB12 Take 1 tablet (1,200 mg total) by mouth every 12 (twelve) hours as needed. (Patient not taking: Reported on 03/04/2015) 14 tablet 1  . HYDROcodone-homatropine (HYCODAN) 5-1.5 MG/5ML syrup Take 5 mLs by mouth at bedtime as needed for cough. (Patient not taking: Reported on 03/04/2015) 120 mL 0  . ipratropium (ATROVENT) 0.03 % nasal spray Place 2 sprays into both nostrils 2 (two) times daily. (Patient not taking: Reported on 03/04/2015) 30 mL 0  . omeprazole (PRILOSEC) 20 MG capsule Take 20 mg by mouth daily.     No facility-administered medications prior to visit.    History   Social History  . Marital Status: Single    Spouse Name: N/A    . Number of Children: N/A  . Years of Education: N/A   Social History Main Topics  . Smoking status: Former Games developer  . Smokeless tobacco: Not on file  . Alcohol Use: 2.4 oz/week    4 Cans of beer per week  . Drug Use: No  . Sexual Activity: Not on file   Other Topics Concern  . None   Social History Narrative     Review of Systems  Constitutional: Positive for fever and fatigue. Negative for chills and appetite change.  HENT: Negative for congestion, ear pain, postnasal drip, sinus pressure and sore throat.   Eyes: Negative for pain and redness.  Respiratory: Negative for cough, shortness of breath and wheezing.   Cardiovascular: Negative for leg swelling.  Gastrointestinal: Negative for nausea, vomiting, abdominal pain, diarrhea, constipation and blood in stool.  Endocrine: Negative for polyuria.  Genitourinary: Negative for dysuria, urgency, frequency and flank pain.  Musculoskeletal: Negative for gait problem.  Skin: Negative for rash.  Neurological: Negative for weakness and headaches.  Psychiatric/Behavioral: Negative for confusion and decreased concentration. The patient is not nervous/anxious.     Objective:  BP 130/90 mmHg  Pulse 87  Temp(Src) 99.3 F (37.4 C) (Oral)  Resp 18  Ht 5\' 7"  (1.702 m)  Wt 215 lb 6 oz (97.693 kg)  BMI 33.72 kg/m2  SpO2 98%  Physical Exam  Constitutional: He is oriented to person, place, and time. He appears well-developed and well-nourished.  HENT:  Head: Normocephalic and atraumatic.  Eyes: Conjunctivae are normal. Pupils are equal, round, and reactive to light.  Pulmonary/Chest: Effort normal.  Musculoskeletal: He exhibits no edema.  Neurological: He is alert and oriented to person, place, and time.  Skin: Skin is dry.  Psychiatric: He has a normal mood and affect. His behavior is normal. Thought content normal.   He has cellulitis of the proximal anterior lower leg.   Assessment & Plan:   Toddy was seen today for  follow-up.  Diagnoses and all orders for this visit:  Cellulitis of left lower extremity  Other orders -     sulfamethoxazole-trimethoprim (BACTRIM DS,SEPTRA DS) 800-160 MG per tablet; Take 2 tablets by mouth 2 (two) times daily.   I am having Mr. Maryann Alar start on sulfamethoxazole-trimethoprim. I am also having him maintain his omeprazole, Guaifenesin, ipratropium, fluticasone, HYDROcodone-homatropine, cephALEXin, and ibuprofen.  Meds ordered this encounter  Medications  . ibuprofen (ADVIL,MOTRIN) 200 MG tablet    Sig: Take 200 mg by mouth every 6 (six) hours as needed.  . sulfamethoxazole-trimethoprim (BACTRIM DS,SEPTRA DS) 800-160 MG per tablet    Sig: Take 2 tablets by mouth 2 (two) times daily.    Dispense:  40 tablet    Refill:  0   He was put on Septra and encouraged to continue taking the Keflex and follow-up with Dr. Clelia Croft. In the meantime he is going to use local heat 4 times a day.  Appropriate red flag conditions were discussed with the patient as well as actions that should be taken.  Patient expressed his understanding.  Follow-up: Return if symptoms worsen or fail to improve.  Carmelina Dane, MD

## 2015-03-05 NOTE — Progress Notes (Signed)
Please overbook this pt into my last slot at 104 7/8 - he will try to come in before 5 pm to 104 for a very quick recheck on his left knee cellulitis. Thanks, Carley HammedEva   Appointment done

## 2015-03-05 NOTE — Patient Instructions (Signed)
Celulitis °(Cellulitis) °La celulitis es una infección de la piel y del tejido subyacente. El área infectada generalmente está de color rojo y duele. Ocurre con más frecuencia en los brazos y en las piernas.  °CAUSAS  °La causa es una bacteria que ingresa en la piel a través de grietas o cortes. Los tipos más frecuentes de bacterias que causan celulitis son los estafilococos y los estreptococos. °SIGNOS Y SÍNTOMAS  °· Enrojecimiento y calor. °· Hinchazón. °· Sensibilidad o dolor. °· Fiebre. °DIAGNÓSTICO  °Por lo general, el médico puede diagnosticar esta afección con un examen físico. Es posible que le indiquen análisis de sangre. °TRATAMIENTO  °El tratamiento consiste en tomar antibióticos. °INSTRUCCIONES PARA EL CUIDADO EN EL HOGAR   °· Tome los antibióticos como le indicó el médico. Termine los antibióticos aunque comience a sentirse mejor. °· Mantenga el brazo o la pierna elevados para reducir la hinchazón. °· Aplique paños calientes en la zona hasta 4 veces al día para calmar el dolor. °· Tome los medicamentos solamente como se lo haya indicado el médico. °· Concurra a todas las visitas de control como se lo haya indicado el médico. °SOLICITE ATENCIÓN MÉDICA SI:  °· Observa una línea roja en la piel que sale desde la zona infectada. °· El área roja se extiende o se vuelve de color oscuro. °· El hueso o la articulación que se encuentran por debajo de la zona infectada le duelen después de que la piel se cura. °· La infección se repite en la misma zona o en una zona diferente. °· Tiene un bulto inflamado en la zona infectada. °· Presenta nuevos síntomas. °· Tiene fiebre. °SOLICITE ATENCIÓN MÉDICA DE INMEDIATO SI:  °· Se siente muy somnoliento. °· Tiene vómitos o diarrea. °· Se siente enfermo (malestar general) con dolores musculares. °ASEGÚRESE DE QUE:  °· Comprende estas instrucciones. °· Controlará su afección. °· Recibirá ayuda de inmediato si no mejora o si empeora. °Document Released: 05/25/2005 Document  Revised: 12/30/2013 °ExitCare® Patient Information ©2015 ExitCare, LLC. This information is not intended to replace advice given to you by your health care provider. Make sure you discuss any questions you have with your health care provider. ° °

## 2015-03-05 NOTE — Progress Notes (Signed)
Patient ID: Scott Best, male   DOB: 09/12/1980, 34 y.o.   MRN: [id number] Pt assessed independently by myself.  Appears to have a very subtle cellulitis - very slight erythema and warmth which was outlined.  RTC immed if worsening. Otherwise recheck in 48 hrs - pt will try to get to 104 on Fri 7/9 to recheck w/ me before 5 pm - overbook onto sched. Reviewed xray and documentation and agree w/ assessment and plan. Norberto SorensonEva Dametrius Sanjuan, MD MPH

## 2015-03-06 ENCOUNTER — Ambulatory Visit: Payer: Self-pay | Admitting: Family Medicine

## 2015-03-16 ENCOUNTER — Ambulatory Visit (INDEPENDENT_AMBULATORY_CARE_PROVIDER_SITE_OTHER): Payer: Self-pay | Admitting: Family Medicine

## 2015-03-16 VITALS — BP 108/72 | HR 82 | Temp 98.3°F | Resp 16 | Ht 67.0 in | Wt 207.0 lb

## 2015-03-16 DIAGNOSIS — L259 Unspecified contact dermatitis, unspecified cause: Secondary | ICD-10-CM

## 2015-03-16 MED ORDER — METHYLPREDNISOLONE ACETATE 80 MG/ML IJ SUSP
80.0000 mg | Freq: Once | INTRAMUSCULAR | Status: AC
  Administered 2015-03-16: 80 mg via INTRAMUSCULAR

## 2015-03-16 NOTE — Patient Instructions (Signed)

## 2015-03-16 NOTE — Progress Notes (Signed)
This chart was scribed for Dr. Elvina Sidle, MD by Jarvis Morgan, Medical Scribe. This patient was seen in Room 4 and the patient's care was started at 7:36 PM.  Patient ID: Scott Best MRN: [id number], DOB: [birth year], 34 y.o. Date of Encounter: 03/16/2015, 7:36 PM  Primary Physician: No PCP Per Patient  Chief Complaint:  Chief Complaint  Patient presents with  . Follow-up    cellulitis    HPI: 34 y.o. year old male with history below presents for a follow up of his cellulitis to his left lower extremity. Pt was was in the office initially on 03/04/15 and again on 03/05/25. He was prescribed Keflex and Bactrim. He states the the swelling in his left leg has subsided but notes he still has some mild tenderness. Pt states he finished the abx treatment. He has begun to break out in a generalized, itchy red rash all over his body. His wife notes the worst areas are to his bilateral lower legs near his ankles.  Pt is unsure what he may have come in contact with to cause the rash. He denies any SOB or cough.  Pt works in Art therapist  Past Medical History  Diagnosis Date  . Allergy   . Anxiety      Home Meds: Prior to Admission medications   Medication Sig Start Date End Date Taking? Authorizing Provider  sulfamethoxazole-trimethoprim (BACTRIM DS,SEPTRA DS) 800-160 MG per tablet Take 2 tablets by mouth 2 (two) times daily. 03/05/15  Yes Carmelina Dane, MD  cephALEXin (KEFLEX) 500 MG capsule Take 1 capsule (500 mg total) by mouth 4 (four) times daily. Patient not taking: Reported on 03/16/2015 03/04/15   Ofilia Neas, PA-C  fluticasone [facility]) 50 MCG/ACT nasal spray Place 2 sprays into both nostrils daily. Patient not taking: Reported on 03/04/2015 08/11/14   Wallis Bamberg, PA-C  Guaifenesin [facility] MAXIMUM STRENGTH) 1200 MG TB12 Take 1 tablet (1,200 mg total) by mouth every 12 (twelve) hours as needed. Patient not taking: Reported on 03/04/2015  08/11/14   Wallis Bamberg, PA-C  HYDROcodone-homatropine [facility]) 5-1.5 MG/5ML syrup Take 5 mLs by mouth at bedtime as needed for cough. Patient not taking: Reported on 03/04/2015 08/11/14   Wallis Bamberg, PA-C  ibuprofen (ADVIL,MOTRIN) 200 MG tablet Take 200 mg by mouth every 6 (six) hours as needed.    Historical Provider, MD  ipratropium (ATROVENT) 0.03 % nasal spray Place 2 sprays into both nostrils 2 (two) times daily. Patient not taking: Reported on 03/04/2015 08/11/14   Wallis Bamberg, PA-C  omeprazole (PRILOSEC) 20 MG capsule Take 20 mg by mouth daily.    Historical Provider, MD    Allergies: No Known Allergies  History   Social History  . Marital Status: Single    Spouse Name: N/A  . Number of Children: N/A  . Years of Education: N/A   Occupational History  . Not on file.   Social History Main Topics  . Smoking status: Former Games developer  . Smokeless tobacco: Not on file  . Alcohol Use: 2.4 oz/week    4 Cans of beer per week  . Drug Use: No  . Sexual Activity: Not on file   Other Topics Concern  . Not on file   Social History Narrative     Review of Systems: Constitutional: negative for chills, fever, night sweats, weight changes, or fatigue  HEENT: negative for vision changes, hearing loss, congestion, rhinorrhea, ST, epistaxis, or sinus pressure Cardiovascular: negative for  chest pain or palpitations Respiratory: negative for hemoptysis, wheezing, shortness of breath, or cough Abdominal: negative for abdominal pain, nausea, vomiting, diarrhea, or constipation Dermatological: positive for rash Neurologic: negative for headache, dizziness, or syncope All other systems reviewed and are otherwise negative with the exception to those above and in the HPI.   Physical Exam: Blood pressure 108/72, pulse 82, temperature 98.3 F (36.8 C), resp. rate 16, height 5\' 7"  (1.702 m), weight 207 lb (93.895 kg), SpO2 98 %., Body mass index is 32.41 kg/(m^2). General: Well developed, well  nourished, in no acute distress. Head: Normocephalic, atraumatic, eyes without discharge, sclera non-icteric, nares are without discharge. Bilateral auditory canals clear, TM's are without perforation, pearly grey and translucent with reflective cone of light bilaterally. Oral cavity moist, posterior pharynx without exudate, erythema, peritonsillar abscess, or post nasal drip.  Neck: Supple. No thyromegaly. Full ROM. No lymphadenopathy. Lungs: Clear bilaterally to auscultation without wheezes, rales, or rhonchi. Breathing is unlabored. Heart: RRR with S1 S2. No murmurs, rubs, or gallops appreciated. Abdomen: Soft, non-tender, non-distended with normoactive bowel sounds. No hepatomegaly. No rebound/guarding. No obvious abdominal masses. Msk:  Strength and tone normal for age. Extremities/Skin: Warm and dry. No clubbing or cyanosis. Patient has a eczematous type rash in the distribution of his socks, with similar papular erythema rash on his forearms. Neuro: Alert and oriented X 3. Moves all extremities spontaneously. Gait is normal. CNII-XII grossly in tact. Psych:  Responds to questions appropriately with a normal affect.     ASSESSMENT AND PLAN:  34 y.o. year old male with   1. Contact dermatitis    Meds ordered this encounter  Medications  . methylPREDNISolone acetate (DEPO-MEDROL) injection 80 mg    Sig:     This chart was scribed in my presence and reviewed by me personally.    ICD-9-CM ICD-10-CM   1. Contact dermatitis 692.9 L25.9 methylPREDNISolone acetate (DEPO-MEDROL) injection 80 mg      Signed, Elvina Sidle, MD 03/16/2015 7:36 PM

## 2015-04-08 ENCOUNTER — Ambulatory Visit: Payer: Self-pay

## 2015-04-08 ENCOUNTER — Ambulatory Visit (INDEPENDENT_AMBULATORY_CARE_PROVIDER_SITE_OTHER): Payer: Self-pay | Admitting: Family Medicine

## 2015-04-08 VITALS — BP 108/80 | HR 73 | Temp 98.3°F | Resp 18 | Ht 67.0 in | Wt 213.0 lb

## 2015-04-08 DIAGNOSIS — L819 Disorder of pigmentation, unspecified: Secondary | ICD-10-CM

## 2015-04-08 LAB — COMPREHENSIVE METABOLIC PANEL
ALT: 27 U/L (ref 9–46)
AST: 22 U/L (ref 10–40)
Albumin: 4.6 g/dL (ref 3.6–5.1)
Alkaline Phosphatase: 81 U/L (ref 40–115)
BUN: 17 mg/dL (ref 7–25)
CALCIUM: 9 mg/dL (ref 8.6–10.3)
CO2: 25 mmol/L (ref 20–31)
Chloride: 106 mmol/L (ref 98–110)
Creat: 1.07 mg/dL (ref 0.60–1.35)
Glucose, Bld: 106 mg/dL — ABNORMAL HIGH (ref 65–99)
Potassium: 4.3 mmol/L (ref 3.5–5.3)
Sodium: 141 mmol/L (ref 135–146)
Total Bilirubin: 0.4 mg/dL (ref 0.2–1.2)
Total Protein: 7.3 g/dL (ref 6.1–8.1)

## 2015-04-08 NOTE — Patient Instructions (Signed)
Continue to monitor for new onset symptoms  Will call with lab results ASAP

## 2015-04-08 NOTE — Progress Notes (Signed)
Subjective:    Patient ID: Scott Best, male    DOB: 1981/02/21, 34 y.o.   MRN: [id number]  HPI    Review of Systems     Objective:   Physical Exam        Assessment & Plan:   [facility] [address], [place] [address] [phone]  Date:  04/08/2015   Name:  IVEN EARNHART   DOB:  1980/11/05   MRN:  [id number]  PCP:  No PCP Per Patient    Chief Complaint: yellow in the face and Night Sweats   History of Present Illness:  Scott Best is a 34 y.o. very pleasant male patient who presents with the following:  Last 3 days family and partner say heis looking extremely yellow  Last night was having sweats but has not checked temperature Not taking any new medication  No chills Developed sore throat this AM No hx of liver pathology Felt dizzy this AM but no longer ahving sx Denies CP, SOB Denies N/V/D  Diffuse mild abd pain this AM, but no longer having trouble No blood in urine or stool no new swelling in LE No new medications + fatigue but does not feel sick   There are no active problems to display for this patient.   Past Medical History  Diagnosis Date  . Allergy   . Anxiety     Past Surgical History  Procedure Laterality Date  . Cholecystectomy    . Hernia repair    . Eye surgery      R eye repair nail injury    Social History  Substance Use Topics  . Smoking status: Former Games developer  . Smokeless tobacco: None  . Alcohol Use: 2.4 oz/week    4 Cans of beer per week    Family History  Problem Relation Age of Onset  . Diabetes Mother   . Heart disease Father   . Cancer Maternal Grandfather   . Cancer Paternal Grandmother     Not on File  Medication list has been reviewed and updated.  Current Outpatient Prescriptions on File Prior to Visit  Medication Sig Dispense Refill  . fluticasone (FLONASE) 50 MCG/ACT nasal spray Place 2 sprays into both nostrils daily. (Patient not taking:  Reported on 03/04/2015) 16 g 12  . Guaifenesin (MUCINEX MAXIMUM STRENGTH) 1200 MG TB12 Take 1 tablet (1,200 mg total) by mouth every 12 (twelve) hours as needed. (Patient not taking: Reported on 03/04/2015) 14 tablet 1  . ibuprofen (ADVIL,MOTRIN) 200 MG tablet Take 200 mg by mouth every 6 (six) hours as needed.    Marland Kitchen ipratropium (ATROVENT) 0.03 % nasal spray Place 2 sprays into both nostrils 2 (two) times daily. (Patient not taking: Reported on 03/04/2015) 30 mL 0  . omeprazole (PRILOSEC) 20 MG capsule Take 20 mg by mouth daily.     No current facility-administered medications on file prior to visit.    Review of Systems:  .all other pertinent ROS negative except as seen in HPI Physical Examination: Filed Vitals:   04/08/15 1458  BP: 108/80  Pulse: 73  Temp: 98.3 F (36.8 C)  Resp: 18   Filed Vitals:   04/08/15 1458  Height:  (1.702 m)  Weight: 213 lb (96.616 kg)   Body mass index is 33.35 kg/(m^2). Ideal Body Weight: Weight in (lb) to have BMI = 25: 159.3 GEN: WDWN, NAD, Non-toxic, A & O x 3 HEENT: Atraumatic, Normocephalic. Neck supple.  No masses, No LAD. Ears and Nose: No external deformity. CV: RRR, No M/G/R. No JVD. No thrill. No extra heart sounds. PULM: CTAB, no wheezes, crackles, rhonchi. No retractions. No resp. distress. No accessory muscle use. ABD: S, NT, ND, +BS. No rebound. No HSM. EXTR: No c/c/e NEURO Normal gait.  PSYCH: Normally interactive. Conversant. Not depressed or anxious appearing.  Calm demeanor.    Assessment and Plan: Discoloration of skin of multiple sites  Hard to determine if significant yellowing of skin but does not have scleral icterus on exam No specific complaints indicating new onset illness Will screen with CMP to ensure no liver etiology or bilirubin dysfunction Will call pt with results  Signed Ben Adams-Doolittle, DO

## 2015-04-14 ENCOUNTER — Telehealth: Payer: Self-pay

## 2015-04-14 NOTE — Telephone Encounter (Signed)
Results for orders placed or performed in visit on 04/08/15  Comprehensive metabolic panel  Result Value Ref Range   Sodium 141 135 - 146 mmol/L   Potassium 4.3 3.5 - 5.3 mmol/L   Chloride 106 98 - 110 mmol/L   CO2 25 20 - 31 mmol/L   Glucose, Bld 106 (H) 65 - 99 mg/dL   BUN 17 7 - 25 mg/dL   Creat 1.61 0.96 - 0.45 mg/dL   Total Bilirubin 0.4 0.2 - 1.2 mg/dL   Alkaline Phosphatase 81 40 - 115 U/L   AST 22 10 - 40 U/L   ALT 27 9 - 46 U/L   Total Protein 7.3 6.1 - 8.1 g/dL   Albumin 4.6 3.6 - 5.1 g/dL   Calcium 9.0 8.6 - 40.9 mg/dL   Called and LMOM- he was seen because family members felt that he appeared yellow- however his exam per Dr. Migdalia Dk was benign.  His labs all look ok, suspect that all is well but if any other concerns please call or RTC.  Will mail him a copy of labs

## 2015-04-14 NOTE — Telephone Encounter (Signed)
Pt calling about labs. Please review. Thanks  

## 2015-11-06 ENCOUNTER — Ambulatory Visit (INDEPENDENT_AMBULATORY_CARE_PROVIDER_SITE_OTHER): Payer: Self-pay | Admitting: Physician Assistant

## 2015-11-06 VITALS — BP 118/80 | HR 72 | Temp 98.2°F | Resp 18 | Ht 67.75 in | Wt 223.0 lb

## 2015-11-06 DIAGNOSIS — B351 Tinea unguium: Secondary | ICD-10-CM

## 2015-11-06 DIAGNOSIS — B353 Tinea pedis: Secondary | ICD-10-CM

## 2015-11-06 DIAGNOSIS — L03116 Cellulitis of left lower limb: Secondary | ICD-10-CM

## 2015-11-06 MED ORDER — SULFAMETHOXAZOLE-TRIMETHOPRIM 800-160 MG PO TABS: 1.0000 | ORAL_TABLET | Freq: Two times a day (BID) | ORAL | Status: DC

## 2015-11-06 MED ORDER — KETOCONAZOLE 2 % EX CREA: 1.0000 "application " | TOPICAL_CREAM | Freq: Every day | CUTANEOUS | Status: DC

## 2015-11-06 MED ORDER — TERBINAFINE HCL 250 MG PO TABS: 250.0000 mg | ORAL_TABLET | Freq: Every day | ORAL | Status: DC

## 2015-11-06 NOTE — Progress Notes (Signed)
Patient ID: Scott Best, male    DOB: Dec 25, 1980, 35 y.o.   MRN: [id number]  PCP: No PCP Per Patient  Subjective:   Chief Complaint  Patient presents with  . Cellulitis    left knee, x3 days    HPI The patient is a 36 year old Hispanic male with a non-significant PMH who presents today with c/o cellulitis inferior to the right knee.   The patient has had cellulitis at this location in the past, states this is similar to the previous infections. He has some swelling, redness and tenderness around the knee with no pain or discomfort bending, walking or weight bearing. He endorses myalgias, fatigue and axillary fever of 99.4 two days ago, since he took an old prescription of Amoxicillin 500mg  BID with some relief and discontinuation of systemic fatigue symptoms. Denies fevers or night sweats, and denies pain with ambulation   Review of Systems As above.   There are no active problems to display for this patient.    Prior to Admission medications   Medication Sig Start Date End Date Taking? Authorizing Provider  amoxicillin (AMOXIL) 500 MG capsule Take 500 mg by mouth 3 (three) times daily.   Yes Historical Provider, MD  ibuprofen (ADVIL,MOTRIN) 200 MG tablet Take 200 mg by mouth every 6 (six) hours as needed.   Yes Historical Provider, MD  omeprazole (PRILOSEC) 20 MG capsule Take 20 mg by mouth daily.   Yes Historical Provider, MD     No Known Allergies     Objective:  Physical Exam  Constitutional: He is oriented to person, place, and time. He appears well-developed and well-nourished. He is active and cooperative. No distress.  BP 118/80 mmHg  Pulse 72  Temp(Src) 98.2 F (36.8 C) (Oral)  Resp 18  Ht 5' 7.75" (1.721 m)  Wt 223 lb (101.152 kg)  BMI 34.15 kg/m2  SpO2 98%   Eyes: Conjunctivae are normal.  Pulmonary/Chest: Effort normal.  Neurological: He is alert and oriented to person, place, and time.  Skin:     Both feet with pitting lesions  consistent with tinea pedis. LEFT great toenail thickened and discolored consistent with onychomycosis.  Psychiatric: He has a normal mood and affect. His speech is normal and behavior is normal.           Assessment & Plan:   1. Cellulitis of knee, left Septra DS. Warm compresses. Rest. Elevation. RTC if not dramatically improved in 48 hours, sooner if worsens. - sulfamethoxazole-trimethoprim (BACTRIM DS,SEPTRA DS) 800-160 MG tablet; Take 1 tablet by mouth 2 (two) times daily.  Dispense: 20 tablet; Refill: 0  2. Onychomycosis LFTs were normal in 02/2015. Start Lamisil. Repeat CMET in 4 weeks. Anticipatory guidance provided. - terbinafine (LAMISIL) 250 MG tablet; Take 1 tablet (250 mg total) by mouth daily.  Dispense: 90 tablet; Refill: 0 - Comprehensive metabolic panel; Future  3. Tinea pedis of both feet Likely due to onychomycosis. Anticipatory guidance provided. - ketoconazole (NIZORAL) 2 % cream; Apply 1 application topically daily.  Dispense: 15 g; Refill: 0   Fernande Bras, PA-C Physician Assistant-Certified [facility]

## 2015-11-06 NOTE — Progress Notes (Signed)
Subjective:     Patient ID: Scott SmilesSergio Z Zuniga-Ortiz, male   DOB: 1981-03-30, 35 y.o.   MRN: [id number]  HPI The patient is a 35 year old Hispanic male with a non-significant PMH who presents today with c/o cellulitis inferior to the right knee. The patient has had cellulitis in the past, states this is similar to the previous infections. He has some swelling, redness and tenderness around the knee with no pain or discomfort bending, walking or weight bearing. He endorses myalgias, fatigue and axillary fever of 99.4 two days ago, since he took an old prescription of Amoxicillin 500mg  BID with some relief and discontinuation of systemic fatigue symptoms.  Denies fevers or night sweats, and denies pain with ambulation  Review of Systems ALl pertinent ROS as indicated above in HPI    Objective:   Physical Exam  Constitutional: He appears well-developed and well-nourished. No distress.  Cardiovascular: Normal rate, regular rhythm, normal heart sounds and intact distal pulses.  Exam reveals no gallop and no friction rub.   No murmur heard. Pulmonary/Chest: Breath sounds normal. No respiratory distress. He has no wheezes. He has no rales.  Musculoskeletal: Normal range of motion. He exhibits edema and tenderness.  Left knee erythematous and tender to palpation with warm to touch. 6cm X 8cm redness area inferior to the patella on the anterior lower extremity. Normal passive ROM, 5/5 muscle strength.  No deficits, full ROM and 5/5 strength of right knee.   Skin: Skin is warm and dry.  Left great toe with yellow discoloration hypertrophic and hyperkeratotic nail.   Right foot plantar surface with sloughing of skin, redness and crusted dried lesions scattered on surface.         Assessment:     The patient is a 35 year old hispanic male who presented with left lower extremity cellulitis. Due to hx of recurrent cellulitis and treatment in the past, will treat similarly for coverage of staph aureus and  skin strep infections with Bactrim. If patient's skin infection does not get significantly better by 48 hours come back to the clinic.     Plan:     1. Cellulitis of knee, left - sulfamethoxazole-trimethoprim (BACTRIM DS,SEPTRA DS) 800-160 MG tablet; Take 1 tablet by mouth 2 (two) times daily.  Dispense: 20 tablet; Refill: 0  2. Onychomycosis - terbinafine (LAMISIL) 250 MG tablet; Take 1 tablet (250 mg total) by mouth daily.  Dispense: 90 tablet; Refill: 0 - Comprehensive metabolic panel; Future 1 month f/u - Educated patient on importance of continuing this medication for 3 months, and risk of increased LFTS with antifungal   3. Tinea pedis of both feet - ketoconazole (NIZORAL) 2 % cream; Apply 1 application topically daily.  Dispense: 15 g; Refill: 0 - Continue use until skin heals and patient has finished 3 month antifungal oral therapy

## 2015-11-06 NOTE — Patient Instructions (Addendum)
Continue treating the skin until the toenail is completely grown out and healthy.  Consider replacing your shoes.

## 2016-06-18 ENCOUNTER — Ambulatory Visit (INDEPENDENT_AMBULATORY_CARE_PROVIDER_SITE_OTHER): Payer: Self-pay | Admitting: Physician Assistant

## 2016-06-18 VITALS — BP 122/84 | HR 70 | Temp 98.3°F | Resp 18 | Ht 67.75 in | Wt 221.0 lb

## 2016-06-18 DIAGNOSIS — L03115 Cellulitis of right lower limb: Secondary | ICD-10-CM

## 2016-06-18 DIAGNOSIS — Z23 Encounter for immunization: Secondary | ICD-10-CM

## 2016-06-18 MED ORDER — SULFAMETHOXAZOLE-TRIMETHOPRIM 800-160 MG PO TABS: 1.0000 | ORAL_TABLET | Freq: Two times a day (BID) | ORAL | 0 refills | Status: DC

## 2016-06-18 NOTE — Patient Instructions (Signed)
     IF you received an x-ray today, you will receive an invoice from [facility]. Please contact [facility] at [phone] with questions or concerns regarding your invoice.   IF you received labwork today, you will receive an invoice from [facility]. Please contact [facility] at [phone] with questions or concerns regarding your invoice.   Our billing staff will not be able to assist you with questions regarding bills from these companies.  You will be contacted with the lab results as soon as they are available. The fastest way to get your results is to activate your My Chart account. Instructions are located on the last page of this paperwork. If you have not heard from us regarding the results in 2 weeks, please contact this office.      

## 2016-06-18 NOTE — Progress Notes (Signed)
Patient ID: Scott Best, male    DOB: 12/16/1980, 35 y.o.   MRN: [id number]  PCP: No PCP Per Patient  Subjective:   Chief Complaint  Patient presents with  . Cellulitis    RIGHT KNEE    HPI Presents for evaluation of presumed cellulitis of the RIGHT lower leg. He is accompanied by his wife.  The skin of the proximal tibia, just below the knee has become red and tender. He has had similar episodes in the past, most recently on the LEFT in 10/2015. He works in [employer] and spends a lot of time kneeling to do his work. He typically wears knee pads, but sometimes skips them, which he thinks is what happened here.  Thinks his last tetanus was about 10 years ago. We have no vaccines recorded.      Review of Systems  Constitutional: Negative for chills and fever.  Respiratory: Negative for cough and shortness of breath.   Cardiovascular: Negative for chest pain, palpitations and leg swelling.  Musculoskeletal: Positive for arthralgias. Negative for gait problem and myalgias.  Skin: Positive for color change. Negative for rash and wound.       There are no active problems to display for this patient.    Prior to Admission medications   Medication Sig Start Date End Date Taking? Authorizing Provider  omeprazole (PRILOSEC) 20 MG capsule Take 20 mg by mouth daily.   Yes Historical Provider, MD     No Known Allergies     Objective:  Physical Exam  Constitutional: He is oriented to person, place, and time. He appears well-developed and well-nourished. He is active and cooperative. No distress.  BP 122/84 (BP Location: Right Arm, Patient Position: Sitting, Cuff Size: Small)   Pulse 70   Temp 98.3 F (36.8 C) (Oral)   Resp 18   Ht 5' 7.75" (1.721 m)   Wt 221 lb (100.2 kg)   SpO2 98%   BMI 33.85 kg/m    Eyes: Conjunctivae are normal.  Pulmonary/Chest: Effort normal.  Musculoskeletal:       Right knee: He exhibits erythema. He exhibits normal range of  motion, no swelling, no effusion and no ecchymosis.       Legs: Neurological: He is alert and oriented to person, place, and time.  Psychiatric: He has a normal mood and affect. His speech is normal and behavior is normal.           Assessment & Plan:   1. Cellulitis of knee, right Rest. Warm compresses. Septra DS. RTC if not significantly improved in 48 hours. Consistent knee protection use at work. Reviewed healthy skin hygiene. - sulfamethoxazole-trimethoprim (BACTRIM DS,SEPTRA DS) 800-160 MG tablet; Take 1 tablet by mouth 2 (two) times daily.  Dispense: 20 tablet; Refill: 0  2. Need for Tdap vaccination - Tdap vaccine greater than or equal to 7yo IM  3. Need for prophylactic vaccination and inoculation against influenza Once well from cellulitis, RTC for seasonal influenza vaccination.   Fernande Bras, PA-C Physician Assistant-Certified [facility]

## 2016-08-08 ENCOUNTER — Ambulatory Visit (INDEPENDENT_AMBULATORY_CARE_PROVIDER_SITE_OTHER): Payer: Self-pay | Admitting: Physician Assistant

## 2016-08-08 VITALS — BP 122/72 | HR 80 | Temp 98.4°F | Resp 17 | Ht 69.0 in | Wt 224.0 lb

## 2016-08-08 DIAGNOSIS — J029 Acute pharyngitis, unspecified: Secondary | ICD-10-CM

## 2016-08-08 MED ORDER — AMOXICILLIN 875 MG PO TABS: 875.0000 mg | ORAL_TABLET | Freq: Two times a day (BID) | ORAL | 0 refills | Status: DC

## 2016-08-08 MED ORDER — NAPROXEN SODIUM 220 MG PO TABS: 440.0000 mg | ORAL_TABLET | Freq: Two times a day (BID) | ORAL | Status: AC

## 2016-08-08 NOTE — Patient Instructions (Addendum)
Do not fill amoxicillin if your throat is improving in three days. It is okay to fill amox any time if you begin to have fever.   For now take two aleve in the morning and night.  If this is helping you improve and you don't need amox then do this for 10 days and not longer.     IF you received an x-ray today, you will receive an invoice from [facility]. Please contact [facility] at [phone] with questions or concerns regarding your invoice.   IF you received labwork today, you will receive an invoice from [facility]. Please contact [facility] at [phone] with questions or concerns regarding your invoice.   Our billing staff will not be able to assist you with questions regarding bills from these companies.  You will be contacted with the lab results as soon as they are available. The fastest way to get your results is to activate your My Chart account. Instructions are located on the last page of this paperwork. If you have not heard from us regarding the results in 2 weeks, please contact this office.

## 2016-08-08 NOTE — Progress Notes (Signed)
08/08/2016 11:08 AM   DOB: Oct 23, 1980 / MRN: [id number]  SUBJECTIVE:  Scott Best is a 35 y.o. male presenting for sore throat started 4 days ago. He complains of nasal congestion and a non bloody cough productive of yellow sputum that started yesterday.  He denies fever, chills, SOB, chest pain, leg swelling. He is a former smoker and smoked 4-5 cigarettes per week for about a year, quit roughly 16 years ago. He denies a history of asthma.  No sick contacts.  He has tried dayquil and nyquil with mild and transient relief.      He has No Known Allergies.   He  has a past medical history of Allergy and Anxiety.    He  reports that he has quit smoking. He has never used smokeless tobacco. He reports that he drinks about 8.4 oz of alcohol per week . He reports that he does not use drugs. He  has no sexual activity history on file. The patient  has a past surgical history that includes Cholecystectomy; Hernia repair; and Eye surgery.  His family history includes Cancer in his maternal grandfather and paternal grandmother; Diabetes in his mother; Heart disease in his father.  Review of Systems  Constitutional: Negative for fever.  Gastrointestinal: Negative for nausea.    The problem list and medications were reviewed and updated by myself where necessary and exist elsewhere in the encounter.   OBJECTIVE:  BP 122/72 (BP Location: Right Arm, Patient Position: Sitting, Cuff Size: Normal)   Pulse 80   Temp 98.4 F (36.9 C) (Oral)   Resp 17   Ht 5\' 9"  (1.753 m)   Wt 224 lb (101.6 kg)   SpO2 97%   BMI 33.08 kg/m   Physical Exam  Constitutional:  Non-toxic appearance.  HENT:  Mouth/Throat: Uvula is midline, oropharynx is clear and moist and mucous membranes are normal.    No results found for this or any previous visit (from the past 72 hour(s)).  No results found.  ASSESSMENT AND PLAN  Scott Best was seen today for sore throat and uri.  Diagnoses and all orders for this  visit:  Sore throat: Self pay and very straight forward.  This is likely viral. Exam normal.  He will hold amox for 3 days and start otc Aleve for now.  If fever at any time or sore throat not improving by day 3 start amox.  -     amoxicillin (AMOXIL) 875 MG tablet; Take 1 tablet (875 mg total) by mouth 2 (two) times daily. -     naproxen sodium (ALEVE) 220 MG tablet; Take 2 tablets (440 mg total) by mouth 2 (two) times daily with a meal.    The patient is advised to call or return to clinic if he does not see an improvement in symptoms, or to seek the care of the closest emergency department if he worsens with the above plan.   Scott Best, MHS, PA-C [facility] 08/08/2016 11:08 AM

## 2016-11-03 ENCOUNTER — Ambulatory Visit (INDEPENDENT_AMBULATORY_CARE_PROVIDER_SITE_OTHER): Payer: Self-pay | Admitting: Urgent Care

## 2016-11-03 VITALS — BP 116/82 | HR 71 | Temp 97.7°F | Resp 18 | Ht 69.0 in | Wt 232.0 lb

## 2016-11-03 DIAGNOSIS — R05 Cough: Secondary | ICD-10-CM

## 2016-11-03 DIAGNOSIS — R067 Sneezing: Secondary | ICD-10-CM

## 2016-11-03 DIAGNOSIS — R059 Cough, unspecified: Secondary | ICD-10-CM

## 2016-11-03 DIAGNOSIS — R0781 Pleurodynia: Secondary | ICD-10-CM

## 2016-11-03 DIAGNOSIS — R109 Unspecified abdominal pain: Secondary | ICD-10-CM

## 2016-11-03 NOTE — Patient Instructions (Addendum)
Tome 500mg  de Tylenol cada 6 horas. Tambien tome naproxen 400-440mg  (2 pastillas) dos veces diaramente. Asegurese que tome 2 litros de agua (1 galon) diaramente.    Costocondritis (Costochondritis) La costocondritis a veces llamada sndrome de Tietze es la hinchazn e irritacin (inflamacin) del tejido Building surveyor(cartlago) que une las costillas con el (esternn). Esto causa dolor en el pecho y la zona de las Oneidacostillas. La costocondritis generalmente desaparece por s misma con el tiempo. Podr tardar Elaina Hoopshasta 6 semanas en curarse o ms, especialmente si no puede restringir sus actividades. CAUSAS  Algunos casos de costocondritis no tienen causa conocida. Las causas posibles son:  Quincy SheehanLesiones (traumatismos).  Ejercicios o actividades relacionadas con Toys ''R'' Uslevantar pesos.  Tos intensa. SIGNOS Y SNTOMAS  Dolor y sensibilidad en el rea de las Osborncostillas.  Dolor que empeora al toser o respirar profundamente.  Dolor que empeora con movimientos especficos. DIAGNSTICO  El mdico le preguntar acerca de sus sntomas y le har un examen fsico. Podr indicarle radiografas para descartar otros problemas. TRATAMIENTO  La costocondritis generalmente desaparece por s misma con el tiempo. El mdico podr recetar algunos medicamentos para Primary school teachercalmar el dolor. INSTRUCCIONES PARA EL CUIDADO EN EL HOGAR   Evite la actividad fsica extenuante. Trate de no esforzar las Guardian Life Insurancecostillas durante las actividades habituales. Aqu se incluyen las [address] Placeactividades en las que Bots[place]ausa los msculos del pecho, abdominales y los msculos laterales, especialmente si debe levantar peso.  Aplique hielo en la zona afectada durante los primeros 2 das despus del inicio del dolor.  Ponga el hielo en una bolsa plstica.  Colquese una toalla entre la piel y la bolsa de hielo.  Deje el hielo durante 20 minutos, y aplquelo 2-3 veces por Futures traderda.  Tome slo medicamentos de venta libre o recetados, segn las indicaciones del mdico. SOLICITE ATENCIN  MDICA SI:  Tiene hinchazn o irritacin en las articulaciones de las costillas. Estos son signos de infeccin.  El dolor no desaparece aunque haga reposo o tome medicamentos para Chief Technology Officerel dolor. SOLICITE ATENCIN MDICA DE INMEDIATO SI:   El dolor aumenta o siente muchas molestias.  Le falta el aire o tiene dificultad para respirar.  Tose y escupe sangre.  Siente falta de aire o dolor en el pecho, sudoracin o vmitos.  Tiene fiebre o sntomas persistentes durante ms de 2 - 3 das.  Tiene fiebre y los sntomas empeoran repentinamente. ASEGRESE DE QUE:   Comprende estas instrucciones.  Controlar su afeccin.  Recibir ayuda de inmediato si no mejora o si empeora. Esta informacin no tiene Theme park managercomo fin reemplazar el consejo del mdico. Asegrese de hacerle al mdico cualquier pregunta que tenga. Document Released: 05/25/2005 Document Revised: 06/05/2013 Elsevier Interactive Patient Education  2017 [employer]     IF you received an x-ray today, you will receive an invoice from [facility]. Please contact [facility] at [phone] with questions or concerns regarding your invoice.   IF you received labwork today, you will receive an invoice from [facility]. Please contact [facility] at [phone] with questions or concerns regarding your invoice.   Our billing staff will not be able to assist you with questions regarding bills from these companies.  You will be contacted with the lab results as soon as they are available. The fastest way to get your results is to activate your My Chart account. Instructions are located on the last page of this paperwork. If you have not heard from us regarding the results in 2 weeks, please contact this office.

## 2016-11-03 NOTE — Progress Notes (Signed)
MRN: [id number] DOB: 1980-10-30  Subjective:   Scott Best is a 36 y.o. male presenting for chief complaint of Chest Pain (severe pain when coughing underneath L ribs)  Reports 1 day history of dry cough, sneezing. Cough elicits left-sided rib pain, also has tenderness with touch. Has not tried medications for relief. Denies fever, shob, n/v, sinus pain, sinus congestion, ear pain, ear drainage, sore throat. Of note, patient's wife reports that he did have a sore throat a couple of weeks ago that is now resolved. He works in [employer], does not have a heavy work load currently.  Scott Best has a current medication list which includes the following prescription(s): omeprazole and amoxicillin. Also has No Known Allergies.  Scott Best  has a past medical history of Allergy and Anxiety. Also  has a past surgical history that includes Cholecystectomy; Hernia repair; and Eye surgery.  Objective:   Vitals: BP 116/82   Pulse 71   Temp 97.7 F (36.5 C) (Oral)   Resp 18   Ht 5\' 9"  (1.753 m)   Wt 232 lb (105.2 kg)   SpO2 97%   BMI 34.26 kg/m   Physical Exam  Constitutional: He is oriented to person, place, and time. He appears well-developed and well-nourished.  HENT:  TM's intact bilaterally, no effusions or erythema. Nasal turbinates pink and moist, nasal passages patent. No sinus tenderness. Oropharynx clear, mucous membranes moist, dentition in good repair.  Eyes: Right eye exhibits no discharge. Left eye exhibits no discharge. No scleral icterus.  Neck: Normal range of motion. Neck supple.  Cardiovascular: Normal rate, regular rhythm and intact distal pulses.  Exam reveals no gallop and no friction rub.   No murmur heard. Pulmonary/Chest: No respiratory distress. He has no wheezes. He has no rales.  Abdominal: Soft. Bowel sounds are normal. He exhibits no distension and no mass. There is tenderness (left-sided flank pain). There is no guarding.  No hepatosplenomegaly. No CVA  tenderness.  Lymphadenopathy:    He has no cervical adenopathy.  Neurological: He is alert and oriented to person, place, and time.  Skin: Skin is warm and dry.   Assessment and Plan :   1. Rib pain on left side 2. Left flank pain 3. Cough 4. Sneezing - Discussed differential which includes costo-chondritis secondary to coughing, sneezing; splenomegaly secondary to EBV, renal stone. For now, we will manage conservatively with APAP and naproxen. RTC if no improvement. Consider blood work, imaging.   Wallis Bamberg, PA-C [facility] [phone] 11/03/2016  3:51 PM

## 2016-12-02 ENCOUNTER — Ambulatory Visit (INDEPENDENT_AMBULATORY_CARE_PROVIDER_SITE_OTHER): Payer: Self-pay | Admitting: Emergency Medicine

## 2016-12-02 VITALS — BP 118/76 | HR 91 | Temp 98.2°F | Resp 16 | Ht 69.0 in | Wt 228.0 lb

## 2016-12-02 DIAGNOSIS — B349 Viral infection, unspecified: Secondary | ICD-10-CM | POA: Insufficient documentation

## 2016-12-02 DIAGNOSIS — R6889 Other general symptoms and signs: Secondary | ICD-10-CM | POA: Insufficient documentation

## 2016-12-02 DIAGNOSIS — R6883 Chills (without fever): Secondary | ICD-10-CM | POA: Insufficient documentation

## 2016-12-02 LAB — POCT INFLUENZA A/B
INFLUENZA A, POC: NEGATIVE
Influenza B, POC: NEGATIVE

## 2016-12-02 MED ORDER — HYDROCODONE-ACETAMINOPHEN 5-325 MG PO TABS: 1.0000 | ORAL_TABLET | Freq: Four times a day (QID) | ORAL | 0 refills | Status: DC | PRN

## 2016-12-02 NOTE — Progress Notes (Signed)
Scott Best 36 y.o.   Chief Complaint  Patient presents with  . Generalized Body Aches    started last night  . Headache  . Chills    denies any fever or cough    HISTORY OF PRESENT ILLNESS: This is a 36 y.o. male played long soccer match last week and the following day felt very achy; yesterday developed fever, chills, and increased achiness with headache and flu-like symptoms.  Influenza  This is a new problem. The current episode started yesterday. The problem occurs constantly. The problem has been waxing and waning. Associated symptoms include chills, congestion, coughing, fatigue, a fever, headaches, myalgias, nausea and weakness. Pertinent negatives include no abdominal pain, arthralgias, chest pain, neck pain, rash, sore throat, swollen glands, urinary symptoms, vertigo or vomiting.     Prior to Admission medications   Medication Sig Start Date End Date Taking? Authorizing Provider  omeprazole (PRILOSEC) 20 MG capsule Take 20 mg by mouth daily.   Yes Historical Provider, MD  HYDROcodone-acetaminophen (NORCO) 5-325 MG tablet Take 1 tablet by mouth every 6 (six) hours as needed for moderate pain. 12/02/16   Shawnetta Lein Victorino December, MD    No Known Allergies  Patient Active Problem List   Diagnosis Date Noted  . Viral illness 12/02/2016  . Chills 12/02/2016  . Flu-like symptoms 12/02/2016    Past Medical History:  Diagnosis Date  . Allergy   . Anxiety     Past Surgical History:  Procedure Laterality Date  . CHOLECYSTECTOMY    . EYE SURGERY     R eye repair nail injury  . HERNIA REPAIR      Social History   Social History  . Marital status: Single    Spouse name: N/A  . Number of children: N/A  . Years of education: N/A   Occupational History  . Corporate investment banker    Social History Main Topics  . Smoking status: Former Games developer  . Smokeless tobacco: Never Used  . Alcohol use 8.4 oz/week    14 Cans of beer per week  . Drug use: No  . Sexual  activity: Not on file   Other Topics Concern  . Not on file   Social History Narrative   Lives with wife in [place]    Family History  Problem Relation Age of Onset  . Diabetes Mother   . Heart disease Father   . Cancer Maternal Grandfather   . Cancer Paternal Grandmother      Review of Systems  Constitutional: Positive for chills, fatigue, fever and malaise/fatigue.  HENT: Positive for congestion. Negative for nosebleeds, sinus pain and sore throat.   Eyes: Negative for blurred vision, double vision, discharge and redness.  Respiratory: Positive for cough. Negative for hemoptysis, shortness of breath and wheezing.   Cardiovascular: Negative for chest pain, palpitations and leg swelling.  Gastrointestinal: Positive for nausea. Negative for abdominal pain, blood in stool, diarrhea and vomiting.  Genitourinary: Negative for dysuria and hematuria.  Musculoskeletal: Positive for myalgias. Negative for arthralgias and neck pain.  Skin: Negative for rash.  Neurological: Positive for weakness and headaches. Negative for vertigo, sensory change and focal weakness.  Endo/Heme/Allergies: Negative.   All other systems reviewed and are negative.  Vitals:   12/02/16 1245  BP: 118/76  Pulse: 91  Resp: 16  Temp: 98.2 F (36.8 C)    Results for orders placed or performed in visit on 12/02/16 (from the past 24 hour(s))  POCT Influenza A/B  Status: None   Collection Time: 12/02/16  1:27 PM  Result Value Ref Range   Influenza A, POC Negative Negative   Influenza B, POC Negative Negative     Physical Exam  Constitutional: He is oriented to person, place, and time. He appears well-developed and well-nourished.  HENT:  Head: Normocephalic and atraumatic.  Right Ear: External ear normal.  Left Ear: External ear normal.  Nose: Nose normal.  Mouth/Throat: Oropharynx is clear and moist. No oropharyngeal exudate.  Eyes: Conjunctivae and EOM are normal. Pupils are equal, round,  and reactive to light.  Neck: Normal range of motion. Neck supple. No JVD present. No thyromegaly present.  Cardiovascular: Normal rate, regular rhythm and normal heart sounds.   Pulmonary/Chest: Effort normal and breath sounds normal.  Abdominal: Soft. Bowel sounds are normal. He exhibits no distension. There is no tenderness.  Musculoskeletal: Normal range of motion.  Lymphadenopathy:    He has no cervical adenopathy.  Neurological: He is alert and oriented to person, place, and time. He displays normal reflexes. No sensory deficit. He exhibits normal muscle tone.  Skin: Skin is warm and dry. Capillary refill takes less than 2 seconds.  Psychiatric: He has a normal mood and affect. His behavior is normal.  Vitals reviewed.    ASSESSMENT & PLAN: Scott Best was seen today for generalized body aches, headache and chills.  Diagnoses and all orders for this visit:  Viral illness  Flu-like symptoms -     POCT Influenza A/B  Chills  Other orders -     HYDROcodone-acetaminophen (NORCO) 5-325 MG tablet; Take 1 tablet by mouth every 6 (six) hours as needed for moderate pain.    Patient Instructions       IF you received an x-ray today, you will receive an invoice from [facility]. Please contact [facility] at [phone] with questions or concerns regarding your invoice.   IF you received labwork today, you will receive an invoice from [facility]. Please contact [facility] at [phone] with questions or concerns regarding your invoice.   Our billing staff will not be able to assist you with questions regarding bills from these companies.  You will be contacted with the lab results as soon as they are available. The fastest way to get your results is to activate your My Chart account. Instructions are located on the last page of this paperwork. If you have not heard from [place] regarding the results in 2 weeks, please contact this office.      Viral Illness,  Adult Viruses are tiny germs that can get into a person's body and cause illness. There are many different types of viruses, and they cause many types of illness. Viral illnesses can range from mild to severe. They can affect various parts of the body. Common illnesses that are caused by a virus include colds and the flu. Viral illnesses also include serious conditions such as HIV/AIDS (human immunodeficiency virus/acquired immunodeficiency syndrome). A few viruses have been linked to certain cancers. What are the causes? Many types of viruses can cause illness. Viruses invade cells in your body, multiply, and cause the infected cells to malfunction or die. When the cell dies, it releases more of the virus. When this happens, you develop symptoms of the illness, and the virus continues to spread to other cells. If the virus takes over the function of the cell, it can cause the cell to divide and grow out of control, as is the case when a virus causes cancer. Different  viruses get into the body in different ways. You can get a virus by:  Swallowing food or water that is contaminated with the virus.  Breathing in droplets that have been coughed or sneezed into the air by an infected person.  Touching a surface that has been contaminated with the virus and then touching your eyes, nose, or mouth.  Being bitten by an insect or animal that carries the virus.  Having sexual contact with a person who is infected with the virus.  Being exposed to blood or fluids that contain the virus, either through an open cut or during a transfusion. If a virus enters your body, your body's defense system (immune system) will try to fight the virus. You may be at higher risk for a viral illness if your immune system is weak. What are the signs or symptoms? Symptoms vary depending on the type of virus and the location of the cells that it invades. Common symptoms of the main types of viral illnesses include: Cold and  flu viruses   Fever.  Headache.  Sore throat.  Muscle aches.  Nasal congestion.  Cough. Digestive system (gastrointestinal) viruses   Fever.  Abdominal pain.  Nausea.  Diarrhea. Liver viruses (hepatitis)   Loss of appetite.  Tiredness.  Yellowing of the skin (jaundice). Brain and spinal cord viruses   Fever.  Headache.  Stiff neck.  Nausea and vomiting.  Confusion or sleepiness. Skin viruses   Warts.  Itching.  Rash. Sexually transmitted viruses   Discharge.  Swelling.  Redness.  Rash. How is this treated? Viruses can be difficult to treat because they live within cells. Antibiotic medicines do not treat viruses because these drugs do not get inside cells. Treatment for a viral illness may include:  Resting and drinking plenty of fluids.  Medicines to relieve symptoms. These can include over-the-counter medicine for pain and fever, medicines for cough or congestion, and medicines to relieve diarrhea.  Antiviral medicines. These drugs are available only for certain types of viruses. They may help reduce flu symptoms if taken early. There are also many antiviral medicines for hepatitis and HIV/AIDS. Some viral illnesses can be prevented with vaccinations. A common example is the flu shot. Follow these instructions at home: Medicines    Take over-the-counter and prescription medicines only as told by your health care provider.  If you were prescribed an antiviral medicine, take it as told by your health care provider. Do not stop taking the medicine even if you start to feel better.  Be aware of when antibiotics are needed and when they are not needed. Antibiotics do not treat viruses. If your health care provider thinks that you may have a bacterial infection as well as a viral infection, you may get an antibiotic.  Do not ask for an antibiotic prescription if you have been diagnosed with a viral illness. That will not make your illness go away  faster.  Frequently taking antibiotics when they are not needed can lead to antibiotic resistance. When this develops, the medicine no longer works against the bacteria that it normally fights. General instructions   Drink enough fluids to keep your urine clear or pale yellow.  Rest as much as possible.  Return to your normal activities as told by your health care provider. Ask your health care provider what activities are safe for you.  Keep all follow-up visits as told by your health care provider. This is important. How is this prevented? Take these actions to reduce  your risk of viral infection:  Eat a healthy diet and get enough rest.  Wash your hands often with soap and water. This is especially important when you are in public places. If soap and water are not available, use hand sanitizer.  Avoid close contact with friends and family who have a viral illness.  If you travel to areas where viral gastrointestinal infection is common, avoid drinking water or eating raw food.  Keep your immunizations up to date. Get a flu shot every year as told by your health care provider.  Do not share toothbrushes, nail clippers, razors, or needles with other people.  Always practice safe sex. Contact a health care provider if:  You have symptoms of a viral illness that do not go away.  Your symptoms come back after going away.  Your symptoms get worse. Get help right away if:  You have trouble breathing.  You have a severe headache or a stiff neck.  You have severe vomiting or abdominal pain. This information is not intended to replace advice given to you by your health care provider. Make sure you discuss any questions you have with your health care provider. Document Released: 12/25/2015 Document Revised: 01/27/2016 Document Reviewed: 12/25/2015 Elsevier Interactive Patient Education  2017 Elsevier Inc.      Edwina Barth, MD [facility]

## 2016-12-02 NOTE — Patient Instructions (Addendum)
   IF you received an x-ray today, you will receive an invoice from [facility]. Please contact [facility] at [phone] with questions or concerns regarding your invoice.   IF you received labwork today, you will receive an invoice from [facility]. Please contact [facility] at [phone] with questions or concerns regarding your invoice.   Our billing staff will not be able to assist you with questions regarding bills from these companies.  You will be contacted with the lab results as soon as they are available. The fastest way to get your results is to activate your My Chart account. Instructions are located on the last page of this paperwork. If you have not heard from us regarding the results in 2 weeks, please contact this office.     Viral Illness, Adult Viruses are tiny germs that can get into a person's body and cause illness. There are many different types of viruses, and they cause many types of illness. Viral illnesses can range from mild to severe. They can affect various parts of the body. Common illnesses that are caused by a virus include colds and the flu. Viral illnesses also include serious conditions such as HIV/AIDS (human immunodeficiency virus/acquired immunodeficiency syndrome). A few viruses have been linked to certain cancers. What are the causes? Many types of viruses can cause illness. Viruses invade cells in your body, multiply, and cause the infected cells to malfunction or die. When the cell dies, it releases more of the virus. When this happens, you develop symptoms of the illness, and the virus continues to spread to other cells. If the virus takes over the function of the cell, it can cause the cell to divide and grow out of control, as is the case when a virus causes cancer. Different viruses get into the body in different ways. You can get a virus by:  Swallowing food or water that is contaminated with the virus.  Breathing in droplets  that have been coughed or sneezed into the air by an infected person.  Touching a surface that has been contaminated with the virus and then touching your eyes, nose, or mouth.  Being bitten by an insect or animal that carries the virus.  Having sexual contact with a person who is infected with the virus.  Being exposed to blood or fluids that contain the virus, either through an open cut or during a transfusion. If a virus enters your body, your body's defense system (immune system) will try to fight the virus. You may be at higher risk for a viral illness if your immune system is weak. What are the signs or symptoms? Symptoms vary depending on the type of virus and the location of the cells that it invades. Common symptoms of the main types of viral illnesses include: Cold and flu viruses   Fever.  Headache.  Sore throat.  Muscle aches.  Nasal congestion.  Cough. Digestive system (gastrointestinal) viruses   Fever.  Abdominal pain.  Nausea.  Diarrhea. Liver viruses (hepatitis)   Loss of appetite.  Tiredness.  Yellowing of the skin (jaundice). Brain and spinal cord viruses   Fever.  Headache.  Stiff neck.  Nausea and vomiting.  Confusion or sleepiness. Skin viruses   Warts.  Itching.  Rash. Sexually transmitted viruses   Discharge.  Swelling.  Redness.  Rash. How is this treated? Viruses can be difficult to treat because they live within cells. Antibiotic medicines do not treat viruses because these drugs do not get inside cells.   Treatment for a viral illness may include:  Resting and drinking plenty of fluids.  Medicines to relieve symptoms. These can include over-the-counter medicine for pain and fever, medicines for cough or congestion, and medicines to relieve diarrhea.  Antiviral medicines. These drugs are available only for certain types of viruses. They may help reduce flu symptoms if taken early. There are also many antiviral  medicines for hepatitis and HIV/AIDS. Some viral illnesses can be prevented with vaccinations. A common example is the flu shot. Follow these instructions at home: Medicines    Take over-the-counter and prescription medicines only as told by your health care provider.  If you were prescribed an antiviral medicine, take it as told by your health care provider. Do not stop taking the medicine even if you start to feel better.  Be aware of when antibiotics are needed and when they are not needed. Antibiotics do not treat viruses. If your health care provider thinks that you may have a bacterial infection as well as a viral infection, you may get an antibiotic.  Do not ask for an antibiotic prescription if you have been diagnosed with a viral illness. That will not make your illness go away faster.  Frequently taking antibiotics when they are not needed can lead to antibiotic resistance. When this develops, the medicine no longer works against the bacteria that it normally fights. General instructions   Drink enough fluids to keep your urine clear or pale yellow.  Rest as much as possible.  Return to your normal activities as told by your health care provider. Ask your health care provider what activities are safe for you.  Keep all follow-up visits as told by your health care provider. This is important. How is this prevented? Take these actions to reduce your risk of viral infection:  Eat a healthy diet and get enough rest.  Wash your hands often with soap and water. This is especially important when you are in public places. If soap and water are not available, use hand sanitizer.  Avoid close contact with friends and family who have a viral illness.  If you travel to areas where viral gastrointestinal infection is common, avoid drinking water or eating raw food.  Keep your immunizations up to date. Get a flu shot every year as told by your health care provider.  Do not share  toothbrushes, nail clippers, razors, or needles with other people.  Always practice safe sex. Contact a health care provider if:  You have symptoms of a viral illness that do not go away.  Your symptoms come back after going away.  Your symptoms get worse. Get help right away if:  You have trouble breathing.  You have a severe headache or a stiff neck.  You have severe vomiting or abdominal pain. This information is not intended to replace advice given to you by your health care provider. Make sure you discuss any questions you have with your health care provider. Document Released: 12/25/2015 Document Revised: 01/27/2016 Document Reviewed: 12/25/2015 Elsevier Interactive Patient Education  2017 Elsevier Inc.   

## 2017-01-13 ENCOUNTER — Encounter: Payer: Self-pay | Admitting: Family Medicine

## 2017-01-13 ENCOUNTER — Ambulatory Visit (INDEPENDENT_AMBULATORY_CARE_PROVIDER_SITE_OTHER): Payer: Self-pay | Admitting: Family Medicine

## 2017-01-13 VITALS — BP 121/73 | HR 82 | Temp 98.7°F | Resp 18 | Ht 68.5 in | Wt 228.0 lb

## 2017-01-13 DIAGNOSIS — M25562 Pain in left knee: Secondary | ICD-10-CM

## 2017-01-13 DIAGNOSIS — M25571 Pain in right ankle and joints of right foot: Secondary | ICD-10-CM

## 2017-01-13 MED ORDER — DICLOFENAC SODIUM 75 MG PO TBEC: 75.0000 mg | DELAYED_RELEASE_TABLET | Freq: Two times a day (BID) | ORAL | 0 refills | Status: DC

## 2017-01-13 NOTE — Progress Notes (Signed)
Patient ID: Blase MessSergio Zuniga Ortiz, male    DOB: 11-Sep-1980  Age: 36 y.o. MRN: [id number]  Chief Complaint  Patient presents with  . Knee Pain    fluid x 1 week    Subjective:   Patient works Holiday representativeconstruction. He does not know of her specific injury but he had been working going up and down ladders last week caring heavy loads. His right knee has had pain and swelling. It goes down a little overnight as well as up in the daytime. He didn't have a specific trauma. He had some problems with it about 6 years ago and was told he had bad cartilage.  Has had some swelling and pain just above his medial aspect of the right ankle also for the last few days. That happened spontaneously.  Current allergies, medications, problem list, past/family and social histories reviewed.  Objective:  BP 121/73   Pulse 82   Temp 98.7 F (37.1 C) (Oral)   Resp 18   Ht 5' 8.5" (1.74 m)   Wt 228 lb (103.4 kg)   SpO2 98%   BMI 34.16 kg/m   Mild effusion right knee. No crepitance. Ligaments seem stable. He has a little ecchymosis and swelling just above the boot line of his right ankle medially. It looks like this is been traumatized, probably bile the rim of the boot from stepping on it wrong.  Assessment & Plan:   Assessment: 1. Acute pain of left knee   2. Pain in joint involving right ankle and foot       Plan: Limit his strenuous activity over the next couple weeks. See instructions.  No orders of the defined types were placed in this encounter.   Meds ordered this encounter  Medications  . diclofenac (VOLTAREN) 75 MG EC tablet    Sig: Take 1 tablet (75 mg total) by mouth 2 (two) times daily.    Dispense:  40 tablet    Refill:  0         Patient Instructions    Take diclofenac 100 mg one twice daily for pain and inflammation in the  If not improved in the next 10-14 days call back and asked for a referral to orthopedics. We do not need to see you again, and tell them I just had said  that you would need to be referred to the orthopedic doctor for evaluation of knee pain.  The ankle bruising and swelling should subside with a little time. If he gets more trouble getting it rechecked.  Return anytime if needed.   IF you received an x-ray today, you will receive an invoice from [facility]. Please contact [facility] at [phone] with questions or concerns regarding your invoice.   IF you received labwork today, you will receive an invoice from [facility]. Please contact [facility] at [phone] with questions or concerns regarding your invoice.   Our billing staff will not be able to assist you with questions regarding bills from these companies.  You will be contacted with the lab results as soon as they are available. The fastest way to get your results is to activate your My Chart account. Instructions are located on the last page of this paperwork. If you have not heard from us regarding the results in 2 weeks, please contact this office.         Return if symptoms worsen or fail to improve.   Aleisa Howk, MD 01/13/2017

## 2017-01-13 NOTE — Patient Instructions (Addendum)
  Take diclofenac 75 mg one twice daily for pain and inflammation in the  If not improved in the next 10-14 days call back and asked for a referral to orthopedics. We do not need to see you again, and tell them I just had said that you would need to be referred to the orthopedic doctor for evaluation of knee pain.  The ankle bruising and swelling should subside with a little time. If he gets more trouble getting it rechecked.  Return anytime if needed.   IF you received an x-ray today, you will receive an invoice from [facility]. Please contact [facility] at [phone] with questions or concerns regarding your invoice.   IF you received labwork today, you will receive an invoice from [facility]. Please contact [facility] at [phone] with questions or concerns regarding your invoice.   Our billing staff will not be able to assist you with questions regarding bills from these companies.  You will be contacted with the lab results as soon as they are available. The fastest way to get your results is to activate your My Chart account. Instructions are located on the last page of this paperwork. If you have not heard from us regarding the results in 2 weeks, please contact this office.

## 2018-08-21 ENCOUNTER — Ambulatory Visit: Payer: Self-pay | Admitting: Nurse Practitioner

## 2018-08-21 VITALS — BP 122/82 | HR 86 | Temp 98.1°F | Wt 228.0 lb

## 2018-08-21 DIAGNOSIS — L259 Unspecified contact dermatitis, unspecified cause: Secondary | ICD-10-CM

## 2018-08-21 MED ORDER — CLOBETASOL PROPIONATE 0.05 % EX GEL: 1.0000 "application " | Freq: Two times a day (BID) | CUTANEOUS | 0 refills | Status: AC

## 2018-08-21 MED ORDER — HYDROXYZINE HCL 10 MG PO TABS: 10.0000 mg | ORAL_TABLET | Freq: Three times a day (TID) | ORAL | 0 refills | Status: AC | PRN

## 2018-08-21 NOTE — Patient Instructions (Signed)
Contact Dermatitis   -Apply medication to the affected areas twice daily for 14 days or until symptoms improve. -Purchase OTC Aveeno Colloidal Oatmeal Bath.  Bathe twice daily until symptoms improve using this solution. -Do not scratch the affected areas. -Avoid hot baths or showers. -Contact our office if symptoms do not improve as you may need an oral steroid at that time. -Follow up as needed.   Dermatitis is redness, soreness, and swelling (inflammation) of the skin. Contact dermatitis is a reaction to certain substances that touch the skin. Many different substances can cause contact dermatitis. There are two types of contact dermatitis:  Irritant contact dermatitis. This type is caused by something that irritates your skin, such as having dry hands from washing them too often with soap. This type does not require previous exposure to the substance for a reaction to occur. This is the most common type.  Allergic contact dermatitis. This type is caused by a substance that you are allergic to, such as poison ivy. This type occurs when you have been exposed to the substance (allergen) and develop a sensitivity to it. Dermatitis may develop soon after your first exposure to the allergen, or it may not develop until the next time you are exposed and every time thereafter. What are the causes? Irritant contact dermatitis is most commonly caused by exposure to:  Makeup.  Soaps.  Detergents.  Bleaches.  Acids.  Metal salts, such as nickel. Allergic contact dermatitis is most commonly caused by exposure to:  Poisonous plants.  Chemicals.  Jewelry.  Latex.  Medicines.  Preservatives in products, such as clothing. What increases the risk? You are more likely to develop this condition if you have:  A job that exposes you to irritants or allergens.  Certain medical conditions, such as asthma or eczema. What are the signs or symptoms? Symptoms of this condition may occur on  your body anywhere the irritant has touched you or is touched by you.  Symptoms include: ? Dryness or flaking. ? Redness. ? Cracks. ? Itching. ? Pain or a burning feeling. ? Blisters. ? Drainage of small amounts of blood or clear fluid from skin cracks. With allergic contact dermatitis, there may also be swelling in areas such as the eyelids, mouth, or genitals. How is this diagnosed? This condition is diagnosed with a medical history and physical exam.  A patch skin test may be performed to help determine the cause.  If the condition is related to your job, you may need to see an occupational medicine specialist. How is this treated? This condition is treated by checking for the cause of the reaction and protecting your skin from further contact. Treatment may also include:  Steroid creams or ointments. Oral steroid medicines may be needed in more severe cases.  Antibiotic medicines or antibacterial ointments, if a skin infection is present.  Antihistamine lotion or an antihistamine taken by mouth to ease itching.  A bandage (dressing). Follow these instructions at home: Skin care  Moisturize your skin as needed.  Apply cool compresses to the affected areas.  Try applying baking soda paste to your skin. Stir water into baking soda until it reaches a paste-like consistency.  Do not scratch your skin, and avoid friction to the affected area.  Avoid the use of soaps, perfumes, and dyes. Medicines  Take or apply over-the-counter and prescription medicines only as told by your health care provider.  If you were prescribed an antibiotic medicine, take or apply the antibiotic as told  by your health care provider. Do not stop using the antibiotic even if your condition improves. Bathing  Try taking a bath with: ? Epsom salts. Follow the instructions on the packaging. You can get these at your local pharmacy or grocery store. ? Baking soda. Pour a small amount into the bath as  directed by your health care provider. ? Colloidal oatmeal. Follow the instructions on the packaging. You can get this at your local pharmacy or grocery store.  Bathe less frequently, such as every other day.  Bathe in lukewarm water. Avoid using hot water. Bandage care  If you were given a bandage (dressing), change it as told by your health care provider.  Wash your hands with soap and water before and after you change your dressing. If soap and water are not available, use hand sanitizer. General instructions  Avoid the substance that caused your reaction. If you do not know what caused it, keep a journal to try to track what caused it. Write down: ? What you eat. ? What cosmetic products you use. ? What you drink. ? What you wear in the affected area. This includes jewelry.  Check the affected areas every day for signs of infection. Check for: ? More redness, swelling, or pain. ? More fluid or blood. ? Warmth. ? Pus or a bad smell.  Keep all follow-up visits as told by your health care provider. This is important. Contact a health care provider if:  Your condition does not improve with treatment.  Your condition gets worse.  You have signs of infection such as swelling, tenderness, redness, soreness, or warmth in the affected area.  You have a fever.  You have new symptoms. Get help right away if:  You have a severe headache, neck pain, or neck stiffness.  You vomit.  You feel very sleepy.  You notice red streaks coming from the affected area.  Your bone or joint underneath the affected area becomes painful after the skin has healed.  The affected area turns darker.  You have difficulty breathing. Summary  Dermatitis is redness, soreness, and swelling (inflammation) of the skin. Contact dermatitis is a reaction to certain substances that touch the skin.  Symptoms of this condition may occur on your body anywhere the irritant has touched you or is touched by  you.  This condition is treated by figuring out what caused the reaction and protecting your skin from further contact. Treatment may also include medicines and skin care.  Avoid the substance that caused your reaction. If you do not know what caused it, keep a journal to try to track what caused it.  Contact a health care provider if your condition gets worse or you have signs of infection such as swelling, tenderness, redness, soreness, or warmth in the affected area. This information is not intended to replace advice given to you by your health care provider. Make sure you discuss any questions you have with your health care provider. Document Released: 08/12/2000 Document Revised: 02/28/2018 Document Reviewed: 02/28/2018 Elsevier Interactive Patient Education  2019 [employer]

## 2018-08-21 NOTE — Progress Notes (Signed)
Subjective:     Scott Best is a 37 y.o. male who presents for evaluation of a rash involving the bilateral forearms and forehead. Rash started 5 days ago. Lesions are red, and blistering in texture. Rash has changed over time. Rash is pruritic. Associated symptoms: none. Patient denies: abdominal pain, congestion, cough, fever, nausea, sore throat and vomiting. Patient has had contacts with similar rash. Patient has had new exposures (soaps, lotions, laundry detergents, foods, medications, plants, insects or animals).  The patient informs he was pulling weeds from around his house and noticed the rash appeared within the next 1 to 2 days.  The patient states that he has had similar rashes, and is requesting an oral steroid at this time.  The following portions of the patient's history were reviewed and updated as appropriate: allergies, current medications and past medical history.  Review of Systems Constitutional: negative Eyes: negative Ears, nose, mouth, throat, and face: negative Respiratory: negative Cardiovascular: negative Gastrointestinal: negative Integument/breast: positive for pruritus, rash, skin color change and skin lesion(s), negative for discharge Neurological: negative   Objective:    BP 122/82 (BP Location: Right Arm, Patient Position: Sitting)   Pulse 86   Temp 98.1 F (36.7 C) (Oral)   Wt 228 lb (103.4 kg)   SpO2 98%   BMI 34.16 kg/m  General:  alert, cooperative and no distress  Skin:   patch of red raised blisters to left forearm, measures approx 2cm x 2cm, 2nd patch to right antecubital region with red raised blisters, measures 3cm x 3xm. Diffuse small patches to forehead immediately superior to bilateral eyebrows     Assessment:    contact dermatitis: Due to [employer] PowerPoison Ivy    Plan:   Exam findings, diagnosis etiology and medication use and indications reviewed with patient. Follow- Up and discharge instructions provided. No emergent/urgent issues  found on exam.  The patient was given a prescription for clobetasol gel.  Did not feel patient needed an oral steroid at this time due to the small localized regions of the rash.  Patient was concerned of spreading with the rash.  Informed the patient that if the rash did spread to contact our office and we would consider providing an oral steroid at that time.  Patient was also instructed to use the Aveeno colloidal oatmeal bath.  Patient also given medication for itching.  Patient education was provided. Patient verbalized understanding of information provided and agrees with plan of care (POC), all questions answered. The patient is advised to call or return to clinic if condition does not see an improvement in symptoms, or to seek the care of the closest emergency department if condition worsens with the above plan.   1. Contact dermatitis, unspecified contact dermatitis type, unspecified trigger  - clobetasol (TEMOVATE) 0.05 % GEL; Apply 1 application topically 2 (two) times daily for 14 days.  Dispense: 1 each; Refill: 0  -Apply medication to the affected areas twice daily for 14 days or until symptoms improve. -Purchase OTC Aveeno Colloidal Oatmeal Bath.  Bathe twice daily until symptoms improve using this solution. -Do not scratch the affected areas. -Avoid hot baths or showers. -Contact our office if symptoms do not improve as you may need an oral steroid at that time. -Follow up as needed.

## 2019-02-13 ENCOUNTER — Ambulatory Visit: Payer: Self-pay | Admitting: Family Medicine

## 2019-02-13 ENCOUNTER — Other Ambulatory Visit: Payer: Self-pay

## 2019-02-13 ENCOUNTER — Encounter: Payer: Self-pay | Admitting: Family Medicine

## 2019-02-13 VITALS — BP 138/91 | HR 74 | Temp 98.5°F | Resp 17 | Ht 67.0 in | Wt 215.0 lb

## 2019-02-13 DIAGNOSIS — R5383 Other fatigue: Secondary | ICD-10-CM

## 2019-02-13 DIAGNOSIS — F329 Major depressive disorder, single episode, unspecified: Secondary | ICD-10-CM

## 2019-02-13 DIAGNOSIS — F321 Major depressive disorder, single episode, moderate: Secondary | ICD-10-CM

## 2019-02-13 DIAGNOSIS — B353 Tinea pedis: Secondary | ICD-10-CM

## 2019-02-13 MED ORDER — TERBINAFINE HCL 1 % EX CREA: 1.0000 "application " | TOPICAL_CREAM | Freq: Two times a day (BID) | CUTANEOUS | 1 refills | Status: DC

## 2019-02-13 MED ORDER — BUPROPION HCL ER (SR) 150 MG PO TB12: ORAL_TABLET | ORAL | 1 refills | Status: DC

## 2019-02-13 MED ORDER — FLUCONAZOLE 150 MG PO TABS: ORAL_TABLET | ORAL | 1 refills | Status: DC

## 2019-02-13 NOTE — Progress Notes (Signed)
Established Patient Office Visit  Subjective:  Patient ID: Scott Best, male    DOB: Jan 14, 1981  Age: 38 y.o. MRN: [id number]  CC:  Chief Complaint  Patient presents with  . Transitions Of Care  . Depression    x 8-12 months    HPI Jadakis Wiand presents for   Patient had  History of depression  Reports that in the past year it was very bad but now he feels better He is seeing a therapist who recommends an antidepressant He denies symptoms of anxiety Onset was a year ago In his family there is no history of bipolar, schizophrenia or depression He denies suicidal thoughts Talking to his therapist is helping but he feels like his depression affects his body more than mind and makes him feel tired with low energy.  No history of seizures He denies heavy alcohol use and drinks occasionally on the weekend No smoking No marijuana  Depression screen [facility] 2/9 02/13/2019 02/13/2019 01/13/2017 12/02/2016 11/03/2016  Decreased Interest 2 0 0 0 0  Down, Depressed, Hopeless 1 0 0 0 0  PHQ - 2 Score 3 0 0 0 0  Altered sleeping 2 - - - -  Tired, decreased energy 2 - - - -  Change in appetite 1 - - - -  Feeling bad or failure about yourself  0 - - - -  Trouble concentrating 1 - - - -  Moving slowly or fidgety/restless 1 - - - -  Suicidal thoughts 0 - - - -  PHQ-9 Score 10 - - - -  Difficult doing work/chores Somewhat difficult - - - -       Past Medical History:  Diagnosis Date  . Allergy   . Anxiety     Past Surgical History:  Procedure Laterality Date  . CHOLECYSTECTOMY    . EYE SURGERY     R eye repair nail injury  . HERNIA REPAIR      Family History  Problem Relation Age of Onset  . Diabetes Mother   . Heart disease Father   . Cancer Maternal Grandfather   . Cancer Paternal Grandmother     Social History   Socioeconomic History  . Marital status: Single    Spouse name: Not on file  . Number of children: Not on file  . Years of education: Not on  file  . Highest education level: Not on file  Occupational History  . Occupation: Corporate investment banker  Social Needs  . Financial resource strain: Not on file  . Food insecurity    Worry: Not on file    Inability: Not on file  . Transportation needs    Medical: Not on file    Non-medical: Not on file  Tobacco Use  . Smoking status: Former Games developer  . Smokeless tobacco: Never Used  Substance and Sexual Activity  . Alcohol use: Yes    Alcohol/week: 14.0 standard drinks    Types: 14 Cans of beer per week  . Drug use: No  . Sexual activity: Not on file  Lifestyle  . Physical activity    Days per week: Not on file    Minutes per session: Not on file  . Stress: Not on file  Relationships  . Social Musician on phone: Not on file    Gets together: Not on file    Attends religious service: Not on file    Active member of club or organization: Not on file  Attends meetings of clubs or organizations: Not on file    Relationship status: Not on file  . Intimate partner violence    Fear of current or ex partner: Not on file    Emotionally abused: Not on file    Physically abused: Not on file    Forced sexual activity: Not on file  Other Topics Concern  . Not on file  Social History Narrative   Lives with wife in [place]    Outpatient Medications Prior to Visit  Medication Sig Dispense Refill  . omeprazole (PRILOSEC) 20 MG capsule Take 20 mg by mouth daily.    . diclofenac (VOLTAREN) 75 MG EC tablet Take 1 tablet (75 mg total) by mouth 2 (two) times daily. (Patient not taking: Reported on 08/21/2018) 40 tablet 0   No facility-administered medications prior to visit.     No Known Allergies  ROS Review of Systems Review of Systems  Constitutional: Negative for activity change, appetite change, chills and fever.  HENT: Negative for congestion, nosebleeds, trouble swallowing and voice change.   Respiratory: Negative for cough, shortness of breath and wheezing.    Gastrointestinal: Negative for diarrhea, nausea and vomiting.  Genitourinary: Negative for difficulty urinating, dysuria, flank pain and hematuria.  Musculoskeletal: Negative for back pain, joint swelling and neck pain.  Neurological: Negative for dizziness, speech difficulty, light-headedness and numbness.  See HPI. All other review of systems negative.     Objective:    Physical Exam  BP (!) 138/91 (BP Location: Right Arm, Patient Position: Sitting, Cuff Size: Large)   Pulse 74   Temp 98.5 F (36.9 C) (Oral)   Resp 17   Ht 5\' 7"  (1.702 m) Comment: per pt  Wt 215 lb (97.5 kg) Comment: per pt  SpO2 97%   BMI 33.67 kg/m  Wt Readings from Last 3 Encounters:  02/13/19 215 lb (97.5 kg)  08/21/18 228 lb (103.4 kg)  01/13/17 228 lb (103.4 kg)   Physical Exam  Constitutional: Oriented to person, place, and time. Appears well-developed and well-nourished.  HENT:  Head: Normocephalic and atraumatic.  Eyes: Conjunctivae and EOM are normal.  Neck: supple, no thyromegaly Cardiovascular: Normal rate, regular rhythm, normal heart sounds and intact distal pulses.  No murmur heard. Pulmonary/Chest: Effort normal and breath sounds normal. No stridor. No respiratory distress. Has no wheezes.  Neurological: Is alert and oriented to person, place, and time.  Skin: Skin is warm. Capillary refill takes less than 2 seconds. Excoriation, flaking noted between toes of the right foot. Psychiatric: Has a normal mood and affect. Behavior is normal. Judgment and thought content normal.    Health Maintenance Due  Topic Date Due  . HIV Screening  04/27/1997    There are no preventive care reminders to display for this patient.  No results found for: TSH Lab Results  Component Value Date   WBC 14.2 (H) 03/04/2015   HGB 15.2 03/04/2015   HCT 43.4 03/04/2015   MCV 95.6 03/04/2015   PLT 226 03/04/2015   Lab Results  Component Value Date   NA 141 04/08/2015   K 4.3 04/08/2015   CO2 25  04/08/2015   GLUCOSE 106 (H) 04/08/2015   BUN 17 04/08/2015   CREATININE 1.07 04/08/2015   BILITOT 0.4 04/08/2015   ALKPHOS 81 04/08/2015   AST 22 04/08/2015   ALT 27 04/08/2015   PROT 7.3 04/08/2015   ALBUMIN 4.6 04/08/2015   CALCIUM 9.0 04/08/2015      Assessment & Plan:  Problem List Items Addressed This Visit    None    Visit Diagnoses    Current 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 episode (HCC)    -  Primary Discussed expected treatment course, dose titration and continuing exercise and counseling Advised to stop taking if he develops apathy or worsening depression to stop taking     Relevant Medications   buPROPion (WELLBUTRIN SR) 150 MG 12 hr tablet   Other Relevant Orders   TSH   VITAMIN D 25 Hydroxy (Vit-D Deficiency, Fractures)   CBC   Fatigue due to depression    -  Will screen for other deficiencies    Relevant Orders   TSH   VITAMIN D 25 Hydroxy (Vit-D Deficiency, Fractures)   CBC   Tinea pedis of right foot    - will treat with oral and topical treatments   Relevant Medications   fluconazole (DIFLUCAN) 150 MG tablet   terbinafine (LAMISIL) 1 % cream      Meds ordered this encounter  Medications  . fluconazole (DIFLUCAN) 150 MG tablet    Sig: Take one tablet by mouth weekly for 4 weeks or until clear    Dispense:  4 tablet    Refill:  1  . buPROPion (WELLBUTRIN SR) 150 MG 12 hr tablet    Sig: Take one tablet by mouth once daily for one week then increase to one tablet by mouth twice daily    Dispense:  60 tablet    Refill:  1  . terbinafine (LAMISIL) 1 % cream    Sig: Apply 1 application topically 2 (two) times daily. Apply to clean dry feet.    Dispense:  30 g    Refill:  1    Follow-up: No follow-ups on file.    Doristine Bosworth, MD

## 2019-02-13 NOTE — Patient Instructions (Addendum)
Start wellbutrin once a day for one week then increase to two times per day daily  For athlete's foot Take antifungal tablet once a week for 2-4 weeks depending on when it clears up Always keep feet dry  Apply topical skin cream twice a day    If you have lab work done today you will be contacted with your lab results within the next 2 weeks.  If you have not heard from us then please contact us. The fastest way to get your results is to register for My Chart.   IF you received an x-ray today, you will receive an invoice from [facility]. Please contact [facility] at [phone] with questions or concerns regarding your invoice.   IF you received labwork today, you will receive an invoice from [facility]. Please contact [facility] at [phone] with questions or concerns regarding your invoice.   Our billing staff will not be able to assist you with questions regarding bills from these companies.  You will be contacted with the lab results as soon as they are available. The fastest way to get your results is to activate your My Chart account. Instructions are located on the last page of this paperwork. If you have not heard from us regarding the results in 2 weeks, please contact this office.     Pie de atleta Athlete's Foot  El pie de atleta (tinea pedis) es una infeccin por hongos en la piel de los pies. Generalmente se produce en la piel que se encuentra entre los dedos o debajo de ellos. Tambin puede aparecer en la planta de los pies. La infeccin puede transmitirse de Neomia Dearuna persona a otra (es contagiosa). Tambin puede propagarse cuando los pies descalzos de una persona entran en contacto con el hongo que est en el piso de la ducha o sobre artculos como zapatos. Cules son las causas? Esta afeccin es causada por un hongo que crece en lugares clidos y hmedos. Puede contagiarse el pie de atleta al compartir zapatos, duchas, toallas y pisos mojados con una  persona infectada. La falta de higiene en los pies o no cambiarse los calcetines con frecuencia puede ocasionar pie de atleta. Qu incrementa el riesgo? Es ms probable que Dietitianesta afeccin se manifieste en:  Los hombres.  Las personas cuyo sistema de defensa del organismo (sistema inmunitario) es dbil.  Las personas con diabetes.  Las personas que usan duchas pblicas, por ejemplo, en un gimnasio.  Las personas que usan zapatos reforzados, Lubriz[employer] Corporationcomo los de uso industrial o Civil engineer, contractingmilitar.  Las Rohm and Haasestaciones en las que el clima es clido y hmedo. Cules son los signos o los sntomas? Los sntomas de esta afeccin incluyen los siguientes:  Zonas que pican entre los dedos o en las plantas de los pies.  Zonas blancas speras o escamosas entre los dedos o en las plantas de los pies.  Ampollas muy pequeas que pican mucho entre los dedos o en las plantas de los pies.  Pequeos cortes en la piel. Estos cortes pueden infectarse.  Uas de los pies engrosadas o pigmentadas. Cmo se diagnostica? Esta afeccin puede diagnosticarse con un examen fsico y Neomia Dearuna revisin de sus antecedentes mdicos. El mdico tambin puede obtener [place]una muestra de piel o de una ua del pie para examinarla bajo microscopio. Cmo se trata? Esta afeccin se trata con antimicticos. Estos medicamentos se pueden aplicar como polvos, ungentos o cremas. En los casos graves, se pueden administrar antimicticos por va oral. Siga estas indicaciones en su casa: Medicamentos  Aplquese o tome los medicamentos de venta libre y los recetados solamente como se lo haya indicado el mdico.  Aplique el medicamento antimictito como se lo haya indicado el mdico. No deje de usar el antimictico, aunque su afeccin mejore. Cuidados de los pies  No se rasque los pies.  Mantenga los pies secos: ? Use calcetines de algodn o lana. Cmbiese los calcetines [employer] o si se mojan. ? Use un tipo de calzado que permita que el aire [place],  como sandalias o zapatillas de lona.  Lvese y squese los pies, incluyendo el rea entre los dedos de los pies. Tambin, lvese y squese los pies: ? Todos los das o como se lo haya indicado el mdico. ? Despus de [employer] fsica. Instrucciones generales  No comparta con otras personas toallas, alicates para las uas u otros objetos de uso personal que tengan contacto con los pies.  Use sandalias cuando tenga que pisar zonas hmedas, como vestuarios y duchas compartidas.  Concurra a todas las visitas de control como se lo haya indicado el mdico. Esto es importante.  Si tiene diabetes, mantenga bajo control el nivel de Dispensing optician. Comunquese con un mdico si:  Tiene fiebre.  Aumenta la hinchazn, el dolor, el calor o el enrojecimiento en el pie.  Los pies no mejoran con Dispensing optician.  Los sntomas empeoran.  Aparecen nuevos sntomas. Resumen  El pie de atleta (tinea pedis) es una infeccin por hongos en la piel de los pies. Generalmente se produce en la piel que se encuentra entre los dedos o debajo de ellos.  Esta afeccin es causada por un hongo que crece en lugares clidos y hmedos.  Los sntomas incluyen zonas blancas speras o escamosas entre los dedos o en las plantas de los pies.  Esta afeccin se trata con antimicticos.  Mantenga los pies limpios. Siempre squelos cuidadosamente. Esta informacin no tiene Marine scientist el consejo del mdico. Asegrese de hacerle al mdico cualquier pregunta que tenga. Document Released: 08/15/2005 Document Revised: 08/07/2017 Document Reviewed: 08/07/2017 Elsevier Interactive Patient Education  2019 [employer].

## 2019-02-14 LAB — CBC
Hematocrit: 45.7 % (ref 37.5–51.0)
Hemoglobin: 16 g/dL (ref 13.0–17.7)
MCH: 32.6 pg (ref 26.6–33.0)
MCHC: 35 g/dL (ref 31.5–35.7)
MCV: 93 fL (ref 79–97)
Platelets: 229 10*3/uL (ref 150–450)
RBC: 4.91 x10E6/uL (ref 4.14–5.80)
RDW: 13.1 % (ref 11.6–15.4)
WBC: 8.7 10*3/uL (ref 3.4–10.8)

## 2019-02-14 LAB — VITAMIN D 25 HYDROXY (VIT D DEFICIENCY, FRACTURES): Vit D, 25-Hydroxy: 16.2 ng/mL — ABNORMAL LOW (ref 30.0–100.0)

## 2019-02-14 LAB — TSH: TSH: 2.22 u[IU]/mL (ref 0.450–4.500)

## 2019-04-16 ENCOUNTER — Ambulatory Visit: Payer: Self-pay | Admitting: Family Medicine

## 2019-04-17 ENCOUNTER — Encounter: Payer: Self-pay | Admitting: Family Medicine

## 2019-05-29 ENCOUNTER — Other Ambulatory Visit: Payer: Self-pay

## 2019-05-29 ENCOUNTER — Encounter: Payer: Self-pay | Admitting: Family Medicine

## 2019-05-29 ENCOUNTER — Ambulatory Visit (INDEPENDENT_AMBULATORY_CARE_PROVIDER_SITE_OTHER): Payer: Self-pay | Admitting: Family Medicine

## 2019-05-29 VITALS — BP 137/81 | HR 75 | Temp 99.1°F | Ht 67.0 in | Wt 231.2 lb

## 2019-05-29 DIAGNOSIS — E559 Vitamin D deficiency, unspecified: Secondary | ICD-10-CM

## 2019-05-29 DIAGNOSIS — F321 Major depressive disorder, single episode, moderate: Secondary | ICD-10-CM

## 2019-05-29 DIAGNOSIS — M25512 Pain in left shoulder: Secondary | ICD-10-CM

## 2019-05-29 MED ORDER — DICLOFENAC SODIUM 75 MG PO TBEC: 75.0000 mg | DELAYED_RELEASE_TABLET | Freq: Two times a day (BID) | ORAL | 1 refills | Status: DC

## 2019-05-29 MED ORDER — BUPROPION HCL ER (SR) 150 MG PO TB12: 150.0000 mg | ORAL_TABLET | Freq: Two times a day (BID) | ORAL | 11 refills | Status: DC

## 2019-05-29 NOTE — Patient Instructions (Addendum)
Take VITAMIN D OVER THE COUNTER 5000 UNITS    If you have lab work done today you will be contacted with your lab results within the next 2 weeks.  If you have not heard from [place] then please contact us. The fastest way to get your results is to register for My Chart.   IF you received an x-ray today, you will receive an invoice from [facility]. Please contact [facility] at [phone] with questions or concerns regarding your invoice.   IF you received labwork today, you will receive an invoice from [facility]. Please contact [facility] at [phone] with questions or concerns regarding your invoice.   Our billing staff will not be able to assist you with questions regarding bills from these companies.  You will be contacted with the lab results as soon as they are available. The fastest way to get your results is to activate your My Chart account. Instructions are located on the last page of this paperwork. If you have not heard from [place] regarding the results in 2 weeks, please contact this office.      Deficiencia de vitaminaD Vitamin D Deficiency La deficiencia de vitaminaD ocurre cuando el organismo no tiene suficiente vitaminaD. La vitaminaD es importante para el organismo por muchos motivos:  Ayuda al organismo a Tax adviser calcio y fsforo, Sports administrator.  Desempea un papel en la salud de los [employer].  Puede ayudar a prevenir [employer], como la diabetes y Curator.  Desempea un papel en la funcin muscular, incluida la actividad cardaca. Si la deficiencia de vitaminaD es grave, puede causar una enfermedad que [employer]. En los adultos, se la conoce como osteomalacia. En los nios, recibe el nombre de raquitismo. Cules son las causas? Esta afeccin puede ser causada por lo siguiente:  No comer suficientes alimentos que contengan vitamina D.  No estar expuesto al sol lo suficiente.  Sufrir ciertas  enfermedades del aparato digestivo que le dificultan al organismo la absorcin de vitaminaD. Estas enfermedades incluyen la enfermedad de Crohn, la pancreatitis crnica y la fibrosis [place].  Someterse a una [place] se extirpa Ardelia Mems parte del estmago o del intestino delgado.  Tener enfermedad renal crnica o enfermedad heptica. Qu incrementa el riesgo? Es ms probable que desarrolle esta afeccin en los siguientes casos:  Es de [place].  No pasa mucho tiempo al [employer].  Vive en un centro de atencin a Barrister's clerk.  Ha tenido fracturas de [place].  Tiene huesos dbiles o finos (osteoporosis).  Tiene una enfermedad o un trastorno que modifica la forma en que el organismo absorbe la vitaminaD.  Tiene piel oscura.  Toma algunos medicamentos, como corticoesteroides o determinados anticonvulsivos.  Tiene sobrepeso u obesidad. Cules son los signos o los sntomas? En los casos leves de deficiencia de vitaminaD, puede no haber sntomas. Si el trastorno es grave, los sntomas pueden incluir lo siguiente:  [employer].  Dolor muscular.  Caerse con frecuencia.  Huesos fracturados por Primary school teacher. Cmo se diagnostica? Esta afeccin se puede diagnosticar con [employer]. Tambin se pueden Geophysical data processor de diagnstico por imgenes, como radiografas, para Solicitor. Cmo se trata? El tratamiento de esta afeccin puede depender de la causa. Las opciones de tratamiento incluyen las siguientes:  Tomar suplementos de vitamina D. El Statistician cul es la dosis ms [place] para usted.  Tomar un suplemento de calcio. El Statistician cul es  la dosis ms adecuada para usted. Siga estas instrucciones en su casa: Comida y bebida   Consuma alimentos que contengan vitaminaD, como por ejemplo: ? Productos lcteos, cereales o jugos fortificados. El trmino fortificado significa que se ha aadido vitaminaD al  [employer]. Revise la etiqueta del envase para ver si el alimento es fortificado. ? Pescados grasos, como el salmn o la [place]. ? Huevos. ? Ostras. ? Hong[place]. Es posible que los productos mencionados arriba no formen una lista completa de las bebidas o los alimentos recomendados. Consulte a un nutricionista para obtener ms informacin. Instrucciones generales  [employer] y los suplementos solamente como se lo haya indicado el mdico.  Reciba luz solar natural de forma regular y segura.  No utilice camas solares.  Mantenga un peso saludable. Baje de peso, si es necesario.  Concurra a todas las visitas de [address] se lo haya indicado el mdico. Esto es importante. Cmo se evita? Para obtener vitaminaD, puede hacer lo siguiente:  Consuma alimentos que contengan naturalmente vitaminaD.  Coma o beba productos que hayan sido fortificados con vitamina D, como cereales, jugos y productos lcteos (incluida la [place]).  Tome un suplemento de vitaminaD o un suplemento multivitamnico que la contenga.  Expngase al sol. El organismo produce vitaminaD de forma natural cuando se expone la piel a la luz del sol. El organismo transforma la luz solar en una forma de vitamina que puede [place]. Comunquese con un mdico si:  Los sntomas no desaparecen.  Siente nuseas o vomita.  Tiene menos deposiciones de lo habitual o est estreido. Resumen  La deficiencia de vitaminaD ocurre cuando el organismo no tiene suficiente vitaminaD.  La vitamina D es importante para el organismo para la buena salud sea y la funcin muscular, y puede ayudar a prevenir algunas enfermedades.  La deficiencia de vitamina D se trata principalmente con suplementos. El Social worker cul es la dosis ms [place] para usted.  Puede obtener vitamina D al consumir alimentos que contengan vitamina D, al estar al sol y al tomar un suplemento de vitamina D o un suplemento multivitamnico que  contenga vitamina D. Esta informacin no tiene Theme park manager el consejo del mdico. Asegrese de hacerle al mdico cualquier pregunta que tenga. Document Released: 11/07/2011 Document Revised: 05/10/2018 Document Reviewed: 05/10/2018 Elsevier Patient Education  2020 [employer].

## 2019-05-29 NOTE — Progress Notes (Signed)
Established Patient Office Visit  Subjective:  Patient ID: Scott Best, male    DOB: 15-Jul-1981  Age: 38 y.o. MRN: [id number]  CC:  Chief Complaint  Patient presents with  . Medication Refill    finished treatment antidepressent     HPI Scott Best presents for    Patient reports that his mood has improved on the wellbturin and he increased it to twice a day as instructed. He denies panic attacks or depression He denies tingling or side effects of the medication Wt Readings from Last 3 Encounters:  05/29/19 231 lb 3.2 oz (104.9 kg)  02/13/19 215 lb (97.5 kg)  08/21/18 228 lb (103.4 kg)     Depression screen [facility] 2/9 05/29/2019 02/13/2019 02/13/2019 01/13/2017 12/02/2016  Decreased Interest 0 2 0 0 0  Down, Depressed, Hopeless 0 1 0 0 0  PHQ - 2 Score 0 3 0 0 0  Altered sleeping - 2 - - -  Tired, decreased energy - 2 - - -  Change in appetite - 1 - - -  Feeling bad or failure about yourself  - 0 - - -  Trouble concentrating - 1 - - -  Moving slowly or fidgety/restless - 1 - - -  Suicidal thoughts - 0 - - -  PHQ-9 Score - 10 - - -  Difficult doing work/chores - Somewhat difficult - - -   Left shoulder pain  He reports previous history of right shoulder pain but states that he currently has left shoulder pain His right shoulder pain resolved with voltaren. He states that the pain on the left is aggravated when he reaches behind him or does twisting movements.   Past Medical History:  Diagnosis Date  . Allergy   . Anxiety     Past Surgical History:  Procedure Laterality Date  . CHOLECYSTECTOMY    . EYE SURGERY     R eye repair nail injury  . HERNIA REPAIR      Family History  Problem Relation Age of Onset  . Diabetes Mother   . Heart disease Father   . Cancer Maternal Grandfather   . Cancer Paternal Grandmother     Social History   Socioeconomic History  . Marital status: Single    Spouse name: Not on file  . Number of children: Not on  file  . Years of education: Not on file  . Highest education level: Not on file  Occupational History  . Occupation: Corporate investment bankerconstruction worker  Social Needs  . Financial resource strain: Not on file  . Food insecurity    Worry: Not on file    Inability: Not on file  . Transportation needs    Medical: Not on file    Non-medical: Not on file  Tobacco Use  . Smoking status: Former Games developermoker  . Smokeless tobacco: Never Used  Substance and Sexual Activity  . Alcohol use: Yes    Alcohol/week: 14.0 standard drinks    Types: 14 Cans of beer per week  . Drug use: No  . Sexual activity: Not on file  Lifestyle  . Physical activity    Days per week: Not on file    Minutes per session: Not on file  . Stress: Not on file  Relationships  . Social Musicianconnections    Talks on phone: Not on file    Gets together: Not on file    Attends religious service: Not on file    Active member of club or  organization: Not on file    Attends meetings of clubs or organizations: Not on file    Relationship status: Not on file  . Intimate partner violence    Fear of current or ex partner: Not on file    Emotionally abused: Not on file    Physically abused: Not on file    Forced sexual activity: Not on file  Other Topics Concern  . Not on file  Social History Narrative   Lives with wife in [place]    Outpatient Medications Prior to Visit  Medication Sig Dispense Refill  . fluconazole (DIFLUCAN) 150 MG tablet Take one tablet by mouth weekly for 4 weeks or until clear 4 tablet 1  . omeprazole (PRILOSEC) 20 MG capsule Take 20 mg by mouth daily.    Marland Kitchen terbinafine (LAMISIL) 1 % cream Apply 1 application topically 2 (two) times daily. Apply to clean dry feet. 30 g 1  . buPROPion (WELLBUTRIN SR) 150 MG 12 hr tablet Take one tablet by mouth once daily for one week then increase to one tablet by mouth twice daily 60 tablet 1  . diclofenac (VOLTAREN) 75 MG EC tablet Take 1 tablet (75 mg total) by mouth 2 (two) times  daily. (Patient not taking: Reported on 08/21/2018) 40 tablet 0   No facility-administered medications prior to visit.     No Known Allergies  ROS Review of Systems Review of Systems  Constitutional: Negative for activity change, appetite change, chills and fever.  HENT: Negative for congestion, nosebleeds, trouble swallowing and voice change.   Respiratory: Negative for cough, shortness of breath and wheezing.   Gastrointestinal: Negative for diarrhea, nausea and vomiting.  Genitourinary: Negative for difficulty urinating, dysuria, flank pain and hematuria.  Musculoskeletal: see hpi  Neurological: Negative for dizziness, speech difficulty, light-headedness and numbness.  See HPI. All other review of systems negative.     Objective:    Physical Exam  BP 137/81 (BP Location: Right Arm, Patient Position: Sitting, Cuff Size: Normal)   Pulse 75   Temp 99.1 F (37.3 C) (Oral)   Ht 5\' 7"  (1.702 m)   Wt 231 lb 3.2 oz (104.9 kg)   SpO2 95%   BMI 36.21 kg/m  Wt Readings from Last 3 Encounters:  05/29/19 231 lb 3.2 oz (104.9 kg)  02/13/19 215 lb (97.5 kg)  08/21/18 228 lb (103.4 kg)   Physical Exam  Constitutional: Oriented to person, place, and time. Appears well-developed and well-nourished.  HENT:  Head: Normocephalic and atraumatic.  Eyes: Conjunctivae and EOM are normal.  Cardiovascular: Normal rate, regular rhythm, normal heart sounds and intact distal pulses.  No murmur heard. Pulmonary/Chest: Effort normal and breath sounds normal. No stridor. No respiratory distress. Has no wheezes.  Neurological: Is alert and oriented to person, place, and time.  Skin: Skin is warm. Capillary refill takes less than 2 seconds.  Psychiatric: Has a normal mood and affect. Behavior is normal. Judgment and thought content normal.   With resistance patient has pain with external rotation No ac joint pain  Normal grip strength Strength 5/5 in both extremities  Health Maintenance Due   Topic Date Due  . HIV Screening  04/27/1997  . INFLUENZA VACCINE  03/30/2019    There are no preventive care reminders to display for this patient.  Lab Results  Component Value Date   TSH 2.220 02/13/2019   Lab Results  Component Value Date   WBC 8.7 02/13/2019   HGB 16.0 02/13/2019   HCT 45.7  02/13/2019   MCV 93 02/13/2019   PLT 229 02/13/2019   Lab Results  Component Value Date   NA 141 04/08/2015   K 4.3 04/08/2015   CO2 25 04/08/2015   GLUCOSE 106 (H) 04/08/2015   BUN 17 04/08/2015   CREATININE 1.07 04/08/2015   BILITOT 0.4 04/08/2015   ALKPHOS 81 04/08/2015   AST 22 04/08/2015   ALT 27 04/08/2015   PROT 7.3 04/08/2015   ALBUMIN 4.6 04/08/2015   CALCIUM 9.0 04/08/2015   No results found for: CHOL No results found for: HDL No results found for: LDLCALC No results found for: TRIG No results found for: CHOLHDL No results found for: CBSW9Q    Assessment & Plan:   Problem List Items Addressed This Visit    None    Visit Diagnoses    Current 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 episode (HCC)    -  Primary Stable continue current meds   Relevant Medications  Stable, continue current meds   buPROPion (WELLBUTRIN SR) 150 MG 12 hr tablet   Vitamin D deficiency       Acute pain of left shoulder  - gave home stretches, advised diclofenac with food and ice     Relevant Medications   diclofenac (VOLTAREN) 75 MG EC tablet      Meds ordered this encounter  Medications  . buPROPion (WELLBUTRIN SR) 150 MG 12 hr tablet    Sig: Take 1 tablet (150 mg total) by mouth 2 (two) times daily.    Dispense:  60 tablet    Refill:  11  . diclofenac (VOLTAREN) 75 MG EC tablet    Sig: Take 1 tablet (75 mg total) by mouth 2 (two) times daily.    Dispense:  60 tablet    Refill:  1    Follow-up: No follow-ups on file.    Doristine Bosworth, MD

## 2019-06-07 ENCOUNTER — Other Ambulatory Visit: Payer: Self-pay

## 2019-06-07 ENCOUNTER — Ambulatory Visit (INDEPENDENT_AMBULATORY_CARE_PROVIDER_SITE_OTHER): Payer: Self-pay | Admitting: Adult Health Nurse Practitioner

## 2019-06-07 ENCOUNTER — Encounter: Payer: Self-pay | Admitting: Adult Health Nurse Practitioner

## 2019-06-07 VITALS — BP 142/100 | HR 55 | Temp 98.8°F | Ht 67.0 in | Wt 228.2 lb

## 2019-06-07 DIAGNOSIS — E559 Vitamin D deficiency, unspecified: Secondary | ICD-10-CM

## 2019-06-07 DIAGNOSIS — R030 Elevated blood-pressure reading, without diagnosis of hypertension: Secondary | ICD-10-CM | POA: Insufficient documentation

## 2019-06-07 DIAGNOSIS — R202 Paresthesia of skin: Secondary | ICD-10-CM | POA: Insufficient documentation

## 2019-06-07 DIAGNOSIS — R200 Anesthesia of skin: Secondary | ICD-10-CM

## 2019-06-07 HISTORY — DX: Paresthesia of skin: R20.0

## 2019-06-07 HISTORY — DX: Vitamin D deficiency, unspecified: E55.9

## 2019-06-07 HISTORY — DX: Elevated blood-pressure reading, without diagnosis of hypertension: R03.0

## 2019-06-07 MED ORDER — VITAMIN D (ERGOCALCIFEROL) 1.25 MG (50000 UNIT) PO CAPS: 50000.0000 [IU] | ORAL_CAPSULE | ORAL | 3 refills | Status: AC

## 2019-06-07 NOTE — Progress Notes (Signed)
Established Patient Office Visit  Subjective:  Patient ID: Scott Best, male    DOB: [birth year]  Age: 38 y.o. MRN: [id number]  CC:  Chief Complaint  Patient presents with  . left Arm and leg Numbness and tingling    going on a week.   . disoriented    going on a week     HPI Scott Best presents for intermittent numbness and tingling to the left arm and lower left extremity. This has occurred intermittently and is associated with increased pulse.  He denies any difficulty walking, no loss of strength, no dizziness, headaches, or diminished dexterity or fine motor coordination.  He works in Holiday representativeconstruction.  He recently was started on Wellbutrin by Dr. Creta LevinStallings and the medication is helping him.  No current anxiety.  Also, hx of low Vitamin D picked up in 01/2019 by Dr. Creta LevinStallings.  He is not on any Vitamin D replacement yet.   Past Medical History:  Diagnosis Date  . Allergy   . Anxiety   . Elevated BP without diagnosis of hypertension 06/07/2019  . Numbness and tingling of left upper and lower extremity 06/07/2019  . Vitamin D deficiency 06/07/2019    Past Surgical History:  Procedure Laterality Date  . CHOLECYSTECTOMY    . EYE SURGERY     R eye repair nail injury  . HERNIA REPAIR      Family History  Problem Relation Age of Onset  . Diabetes Mother   . Heart disease Father   . Cancer Maternal Grandfather   . Cancer Paternal Grandmother     Social History   Socioeconomic History  . Marital status: Single    Spouse name: Not on file  . Number of children: Not on file  . Years of education: Not on file  . Highest education level: Not on file  Occupational History  . Occupation: Corporate investment bankerconstruction worker  Social Needs  . Financial resource strain: Not on file  . Food insecurity    Worry: Not on file    Inability: Not on file  . Transportation needs    Medical: Not on file    Non-medical: Not on file  Tobacco Use  . Smoking status: Former Games developermoker  .  Smokeless tobacco: Never Used  Substance and Sexual Activity  . Alcohol use: Yes    Alcohol/week: 14.0 standard drinks    Types: 14 Cans of beer per week  . Drug use: No  . Sexual activity: Not on file  Lifestyle  . Physical activity    Days per week: Not on file    Minutes per session: Not on file  . Stress: Not on file  Relationships  . Social Musicianconnections    Talks on phone: Not on file    Gets together: Not on file    Attends religious service: Not on file    Active member of club or organization: Not on file    Attends meetings of clubs or organizations: Not on file    Relationship status: Not on file  . Intimate partner violence    Fear of current or ex partner: Not on file    Emotionally abused: Not on file    Physically abused: Not on file    Forced sexual activity: Not on file  Other Topics Concern  . Not on file  Social History Narrative   Lives with [place] in [place]    Outpatient Medications Prior to Visit  Medication Sig Dispense Refill  .  buPROPion (WELLBUTRIN SR) 150 MG 12 hr tablet Take 1 tablet (150 mg total) by mouth 2 (two) times daily. 60 tablet 11  . diclofenac (VOLTAREN) 75 MG EC tablet Take 1 tablet (75 mg total) by mouth 2 (two) times daily. 60 tablet 1  . omeprazole (PRILOSEC) 20 MG capsule Take 20 mg by mouth daily.    Marland Kitchen terbinafine (LAMISIL) 1 % cream Apply 1 application topically 2 (two) times daily. Apply to clean dry feet. 30 g 1  . fluconazole (DIFLUCAN) 150 MG tablet Take one tablet by mouth weekly for 4 weeks or until clear 4 tablet 1   No facility-administered medications prior to visit.     No Known Allergies  ROS Review of Systems  Constitutional: Negative for activity change and fatigue.  Cardiovascular: Negative.   Endocrine: Negative.   Neurological: Positive for numbness.       + for numbness, tingling.  Negative for loss of strength, change in dexterity    Psychiatric/Behavioral: Negative for dysphoric mood and suicidal  ideas. The patient is not nervous/anxious.       Objective:    Physical Exam   Physical Exam  Constitutional: Oriented to person, place, and time. Appears well-developed and well-nourished.  HENT:  Head: Normocephalic and atraumatic.  Eyes: Conjunctivae and EOM are normal.  Cardiovascular: Normal rate, regular rhythm, normal heart sounds and intact distal pulses.  No murmur heard. Pulmonary/Chest: Effort normal and breath sounds normal. No stridor. No respiratory distress. Has no wheezes.  Neurological: Is alert and oriented to person, place, and time. 5/5 strength in upper and lower left extremities in all muscle groups.   Skin: Skin is warm. Capillary refill takes less than 2 seconds.  Psychiatric: Has a normal mood and affect. Behavior is normal. Judgment and thought content normal.    BP (!) 142/100   Pulse (!) 55   Temp 98.8 F (37.1 C) (Oral)   Ht 5\' 7"  (1.702 m)   Wt 228 lb 3.2 oz (103.5 kg)   SpO2 95%   BMI 35.74 kg/m  Wt Readings from Last 3 Encounters:  06/07/19 228 lb 3.2 oz (103.5 kg)  05/29/19 231 lb 3.2 oz (104.9 kg)  02/13/19 215 lb (97.5 kg)     Health Maintenance Due  Topic Date Due  . HIV Screening  04/27/1997  . INFLUENZA VACCINE  03/30/2019    There are no preventive care reminders to display for this patient.  Lab Results  Component Value Date   TSH 2.220 02/13/2019   Lab Results  Component Value Date   WBC 8.7 02/13/2019   HGB 16.0 02/13/2019   HCT 45.7 02/13/2019   MCV 93 02/13/2019   PLT 229 02/13/2019   Lab Results  Component Value Date   NA 141 04/08/2015   K 4.3 04/08/2015   CO2 25 04/08/2015   GLUCOSE 106 (H) 04/08/2015   BUN 17 04/08/2015   CREATININE 1.07 04/08/2015   BILITOT 0.4 04/08/2015   ALKPHOS 81 04/08/2015   AST 22 04/08/2015   ALT 27 04/08/2015   PROT 7.3 04/08/2015   ALBUMIN 4.6 04/08/2015   CALCIUM 9.0 04/08/2015     Differential Diagnosis includes:  Diabetes, nerve compression, Vitamin D deficiency,  B12 deficiency, anemia, or hypertension.  Will address his elevated BP on Tuesday when blood work returns.  I am going to go ahead and prescribe him Vitamin D due to it being so low previously.  Will discuss on Tuesday next steps which may include starting  anti-hypertensive.  He is comfortable with this plan.    Assessment & Plan:    Follow-up  6 weeks   Glyn Ade, NP

## 2019-06-07 NOTE — Patient Instructions (Signed)
° ° ° °  If you have lab work done today you will be contacted with your lab results within the next 2 weeks.  If you have not heard from us then please contact us. The fastest way to get your results is to register for My Chart. ° ° °IF you received an x-ray today, you will receive an invoice from [facility]. Please contact [facility] at [phone] with questions or concerns regarding your invoice.  ° °IF you received labwork today, you will receive an invoice from [facility]. Please contact [facility] at [phone] with questions or concerns regarding your invoice.  ° °Our billing staff will not be able to assist you with questions regarding bills from these companies. ° °You will be contacted with the lab results as soon as they are available. The fastest way to get your results is to activate your My Chart account. Instructions are located on the last page of this paperwork. If you have not heard from us regarding the results in 2 weeks, please contact this office. °  ° ° ° °

## 2019-06-08 LAB — CMP14+EGFR
ALT: 57 IU/L — ABNORMAL HIGH (ref 0–44)
AST: 55 IU/L — ABNORMAL HIGH (ref 0–40)
Albumin/Globulin Ratio: 2.1 (ref 1.2–2.2)
Albumin: 4.7 g/dL (ref 4.0–5.0)
Alkaline Phosphatase: 88 IU/L (ref 39–117)
BUN/Creatinine Ratio: 10 (ref 9–20)
BUN: 11 mg/dL (ref 6–20)
Bilirubin Total: 0.4 mg/dL (ref 0.0–1.2)
CO2: 20 mmol/L (ref 20–29)
Calcium: 9 mg/dL (ref 8.7–10.2)
Chloride: 110 mmol/L — ABNORMAL HIGH (ref 96–106)
Creatinine, Ser: 1.15 mg/dL (ref 0.76–1.27)
GFR calc Af Amer: 93 mL/min/{1.73_m2} (ref >59)
GFR calc non Af Amer: 81 mL/min/{1.73_m2} (ref >59)
Globulin, Total: 2.2 g/dL (ref 1.5–4.5)
Glucose: 88 mg/dL (ref 65–99)
Potassium: 3.7 mmol/L (ref 3.5–5.2)
Sodium: 143 mmol/L (ref 134–144)
Total Protein: 6.9 g/dL (ref 6.0–8.5)

## 2019-06-08 LAB — CBC WITH DIFFERENTIAL/PLATELET
Basophils Absolute: 0.1 10*3/uL (ref 0.0–0.2)
Basos: 1 %
EOS (ABSOLUTE): 0.1 10*3/uL (ref 0.0–0.4)
Eos: 1 %
Hematocrit: 42.2 % (ref 37.5–51.0)
Hemoglobin: 14.6 g/dL (ref 13.0–17.7)
Immature Grans (Abs): 0 10*3/uL (ref 0.0–0.1)
Immature Granulocytes: 0 %
Lymphocytes Absolute: 3.4 10*3/uL — ABNORMAL HIGH (ref 0.7–3.1)
Lymphs: 34 %
MCH: 31.7 pg (ref 26.6–33.0)
MCHC: 34.6 g/dL (ref 31.5–35.7)
MCV: 92 fL (ref 79–97)
Monocytes Absolute: 0.6 10*3/uL (ref 0.1–0.9)
Monocytes: 6 %
Neutrophils Absolute: 5.8 10*3/uL (ref 1.4–7.0)
Neutrophils: 58 %
Platelets: 242 10*3/uL (ref 150–450)
RBC: 4.6 x10E6/uL (ref 4.14–5.80)
RDW: 13 % (ref 11.6–15.4)
WBC: 10.1 10*3/uL (ref 3.4–10.8)

## 2019-06-08 LAB — VITAMIN B12: Vitamin B-12: 598 pg/mL (ref 232–1245)

## 2019-06-11 ENCOUNTER — Encounter: Payer: Self-pay | Admitting: Adult Health Nurse Practitioner

## 2019-06-18 ENCOUNTER — Telehealth: Payer: Self-pay | Admitting: Family Medicine

## 2019-06-18 NOTE — Telephone Encounter (Signed)
Pt's wife would like a cb concerning his lab results. Please advise at [phone]

## 2019-06-20 NOTE — Telephone Encounter (Signed)
Spoke with pt wife and informed her of labs, she verbalized understanding.

## 2019-06-21 ENCOUNTER — Encounter (HOSPITAL_COMMUNITY): Payer: Self-pay

## 2019-06-21 ENCOUNTER — Ambulatory Visit (HOSPITAL_COMMUNITY)
Admission: EM | Admit: 2019-06-21 | Discharge: 2019-06-21 | Disposition: A | Discharge status: Home / Self Care | Payer: Self-pay | Attending: Urgent Care | Admitting: Urgent Care

## 2019-06-21 ENCOUNTER — Other Ambulatory Visit: Payer: Self-pay

## 2019-06-21 DIAGNOSIS — M25521 Pain in right elbow: Secondary | ICD-10-CM

## 2019-06-21 DIAGNOSIS — M7021 Olecranon bursitis, right elbow: Secondary | ICD-10-CM

## 2019-06-21 MED ORDER — MELOXICAM 7.5 MG PO TABS: 7.5000 mg | ORAL_TABLET | Freq: Every day | ORAL | 0 refills | Status: DC

## 2019-06-21 MED ORDER — PREDNISONE 10 MG PO TABS: ORAL_TABLET | ORAL | 0 refills | Status: DC

## 2019-06-21 NOTE — Discharge Instructions (Signed)
Toma meloxicam hoy en la noche. Comienza prednisone (el steroide) manana. Despues de 5 dias cuando terminas el steroide, puedes usar meloxicam si lo ocupas. Si no hay mejoria en 2 dias, regresa a Air traffic controller.   Olecranon bursitis.

## 2019-06-21 NOTE — ED Triage Notes (Signed)
Patient presents to Urgent Care with complaints of right elbow pain and swelling since 5 days ago. Patient reports he has not taken anything for pain.

## 2019-06-21 NOTE — ED Provider Notes (Signed)
MRN: [id number] DOB: [birth year]  Subjective:   Rowen Wilmer is a 38 y.o. male presenting for 5-day history of acute onset intermittent right elbow pain and swelling.  Patient states that he used some leftover Voltaren from a previous prescription and thinks it helps some.  He denies any kind of fever, warmth, redness, trauma.  Patient denies any history of gout.  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  No current facility-administered medications for this encounter.   Current Outpatient Medications:  .  buPROPion (WELLBUTRIN SR) 150 MG 12 hr tablet, Take 1 tablet (150 mg total) by mouth 2 (two) times daily., Disp: 60 tablet, Rfl: 11 .  diclofenac (VOLTAREN) 75 MG EC tablet, Take 1 tablet (75 mg total) by mouth 2 (two) times daily., Disp: 60 tablet, Rfl: 1 .  omeprazole (PRILOSEC) 20 MG capsule, Take 20 mg by mouth daily., Disp: , Rfl:  .  terbinafine (LAMISIL) 1 % cream, Apply 1 application topically 2 (two) times daily. Apply to clean dry feet., Disp: 30 g, Rfl: 1 .  Vitamin D, Ergocalciferol, (DRISDOL) 1.25 MG (50000 UT) CAPS capsule, Take 1 capsule (50,000 Units total) by mouth every 7 (seven) days., Disp: 5 capsule, Rfl: 3   No Known Allergies  Past Medical History:  Diagnosis Date  . Allergy   . Anxiety   . Elevated BP without diagnosis of hypertension 06/07/2019  . Numbness and tingling of left upper and lower extremity 06/07/2019  . Vitamin D deficiency 06/07/2019     Past Surgical History:  Procedure Laterality Date  . CHOLECYSTECTOMY    . EYE SURGERY     R eye repair nail injury  . HERNIA REPAIR      ROS  Objective:   Vitals: BP (!) 137/103 (BP Location: Left Arm)   Pulse 88   Temp 98.6 F (37 C) (Oral)   Resp 16   SpO2 100%   Physical Exam Constitutional:      Appearance: Normal appearance. He is well-developed and normal weight.  HENT:     Head: Normocephalic and atraumatic.     Right Ear:  External ear normal.     Left Ear: External ear normal.     Nose: Nose normal.     Mouth/Throat:     Pharynx: Oropharynx is clear.  Eyes:     Extraocular Movements: Extraocular movements intact.     Pupils: Pupils are equal, round, and reactive to light.  Cardiovascular:     Rate and Rhythm: Normal rate.  Pulmonary:     Effort: Pulmonary effort is normal.  Musculoskeletal:     Right elbow: He exhibits swelling (trace over olecranon process). He exhibits normal range of motion, no effusion, no deformity and no laceration. Tenderness found. Olecranon process tenderness noted. No radial head, no medial epicondyle and no lateral epicondyle tenderness noted.     Comments: No erythema, but there is mild warmth.  Neurological:     Mental Status: He is alert and oriented to person, place, and time.  Psychiatric:        Mood and Affect: Mood normal.        Behavior: Behavior normal.      Assessment and Plan :   1. Olecranon bursitis of right elbow   2. Right elbow pain     We will manage patient for inflammatory process secondary to the nature of his work.  Will have patient stop Voltaren, use  combination of meloxicam and prednisone.  Counseled that if he has no improvement in 2 days, he is to return to clinic and we will pursue further work-up.  Low suspicion for septic arthritis currently.  We will pursue imaging, CBC Sunday if he has no improvement.  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 Counseled patient on potential for adverse effects with medications prescribed/recommended today, ER and return-to-clinic precautions discussed, patient verbalized understanding.    Wallis Bamberg, [place] 06/21/19 1907

## 2019-06-24 ENCOUNTER — Encounter: Payer: Self-pay | Admitting: Family Medicine

## 2019-06-24 ENCOUNTER — Other Ambulatory Visit: Payer: Self-pay

## 2019-06-24 ENCOUNTER — Ambulatory Visit (INDEPENDENT_AMBULATORY_CARE_PROVIDER_SITE_OTHER): Payer: Self-pay | Admitting: Family Medicine

## 2019-06-24 VITALS — BP 142/90 | HR 86 | Temp 99.0°F | Ht 67.0 in | Wt 225.8 lb

## 2019-06-24 DIAGNOSIS — F329 Major depressive disorder, single episode, unspecified: Secondary | ICD-10-CM

## 2019-06-24 DIAGNOSIS — R0683 Snoring: Secondary | ICD-10-CM

## 2019-06-24 DIAGNOSIS — F419 Anxiety disorder, unspecified: Secondary | ICD-10-CM

## 2019-06-24 DIAGNOSIS — R030 Elevated blood-pressure reading, without diagnosis of hypertension: Secondary | ICD-10-CM

## 2019-06-24 MED ORDER — ESCITALOPRAM OXALATE 20 MG PO TABS: 20.0000 mg | ORAL_TABLET | Freq: Every day | ORAL | 3 refills | Status: DC

## 2019-06-24 NOTE — Patient Instructions (Addendum)
If you have lab work done today you will be contacted with your lab results within the next 2 weeks.  If you have not heard from [place] then please contact us. The fastest way to get your results is to register for My Chart.   IF you received an x-ray today, you will receive an invoice from [facility]. Please contact [facility] at [phone] with questions or concerns regarding your invoice.   IF you received labwork today, you will receive an invoice from [place]. Please contact [facility] at [phone] with questions or concerns regarding your invoice.   Our billing staff will not be able to assist you with questions regarding bills from these companies.  You will be contacted with the lab results as soon as they are available. The fastest way to get your results is to activate your My Chart account. Instructions are located on the last page of this paperwork. If you have not heard from [place] regarding the results in 2 weeks, please contact this office.      Cmo controlar su hipertensin Managing Your Hypertension La hipertensin se denomina usualmente presin arterial alta. Ocurre cuando la sangre presiona contra las paredes de las arterias con demasiada fuerza. Las arterias son los vasos sanguneos que transportan la sangre desde el corazn hacia todas las partes del cuerpo. La hipertensin hace que el corazn haga ms esfuerzo para Chiropodist y [employer] Corporation que las arterias se Teacher, music o Advertising account executive. La hipertensin no tratada o no controlada puede causar infarto de miocardio, accidente cerebrovascular, enfermedad renal y otros problemas. Qu son las Futures trader de presin arterial? Una lectura de la presin arterial consiste de un nmero ms alto sobre un nmero ms bajo. En condiciones ideales, la presin arterial debe estar por debajo de 120/80. El primer nmero ("superior") es la presin sistlica. Es la medida de la presin de las arterias cuando el corazn  late. El segundo nmero ("inferior") es la presin diastlica. Es la medida de la presin en las arterias cuando el corazn se relaja. Qu significa mi lectura de presin arterial? La presin arterial se clasifica en cuatro etapas. Sobre la base de la lectura de su presin arterial, el mdico puede usar las siguientes etapas para determinar si necesita tratamiento y de qu tipo. La presin sistlica y la presin diastlica se miden en una unidad llamada mm Hg. Normal  Presin sistlica: por debajo de 202.  Presin diastlica: por debajo de 80. Elevada  Presin sistlica: 542-706.  Presin diastlica: por debajo de 80. Etapa 1 de hipertensin  Presin sistlica: 237-628.  Presin diastlica: 31-51. Etapa 2 de hipertensin  Presin sistlica: 761 o ms.  Presin diastlica: 90 o ms. Cules son los riesgos para la salud asociados con la hipertensin? Controlar la hipertensin es una responsabilidad importante. La hipertensin no controlada puede causar:  Infarto de miocardio.  Accidente cerebrovascular.  Debilitamiento de los vasos sanguneos (aneurisma).  Insuficiencia cardaca.  Dao renal.  Dao ocular.  Sndrome metablico.  Problemas de memoria y concentracin. Qu cambios puedo hacer para controlar mi hipertensin? La hipertensin se puede controlar haciendo McDonald[employer] Corporation estilo de vida y, posiblemente, tomando medicamentos. Su mdico le ayudar a crear un plan para bajar la presin arterial al rango normal. Comida y bebida   Siga una dieta con alto contenido de fibras y [place]in, y con bajo contenido de sal (sodio), azcar agregada y Daphene Jaeger. Un ejemplo de plan alimenticio es la dieta DASH (Dietary Approaches to Stop Hypertension, Mtodos  alimenticios para detener la hipertensin). Para alimentarse de esta manera: ? Coma mucha fruta y verdura fresca. Trate de que la mitad del plato de cada comida sea de frutas y verduras. ? Coma cereales integrales, como pasta  integral, arroz integral y pan integral. Llene aproximadamente un cuarto del plato con cereales integrales. ? Consuma productos lcteos con bajo contenido de grasa. ? Evite la ingesta de cortes de carne grasa, carne procesada o curada, y carne de ave con piel. Llene aproximadamente un cuarto del plato con protenas magras, como pescado, pollo sin piel, frijoles, huevos y tofu. ? Evite ingerir alimentos prehechos o procesados. En general, estos tienen mayor cantidad de sodio, azcar agregada y Steffanie Rain[place].  Reduzca su ingesta diaria de sodio. La mayora de las personas que tienen hipertensin deben comer menos de 1500 mg de sodio por [employer].  Limite el consumo de alcohol a no ms de 1 medida por da si es mujer y no est Orthoptist y a 2 medidas por da si es hombre. Una medida equivale a 12onzas de cerveza, 5onzas de vino o 1onzas de bebidas alcohlicas de alta graduacin. Estilo de vida  Trabaje con su mdico para mantener un peso saludable o Curator. Pregntele cual es su peso recomendado.  Realice al menos 30 minutos de ejercicio que haga que se acelere su corazn (ejercicio Magazine features editor) la DIRECTV de la [place]. Estas actividades pueden incluir caminar, nadar o andar en bicicleta.  Incluya ejercicios para fortalecer sus msculos (ejercicios de resistencia), como levantamiento de pesas, como parte de su rutina semanal de ejercicios. Intente realizar de este tipo de ejercicios al Kellogg a la [place].  No consuma ningn producto que contenga nicotina o tabaco, como cigarrillos y Administrator, Civil Service. Si necesita ayuda para dejar de fumar, consulte al American Express.  Controle las enfermedades a largo plazo (crnicas), como el colesterol alto o la diabetes. Control  Contrlese la presin arterial en su casa segn las indicaciones del mdico. La presin arterial deseada puede variar en funcin de las enfermedades, la edad y otros factores personales.  Contrlese la presin  arterial de manera regular, en la frecuencia indicada por su mdico. Trabaje con su mdico  Revise con su mdico todos los medicamentos que toma ya que puede haber efectos secundarios o interacciones.  Hable con su mdico acerca de la dieta, hbitos de ejercicio y otros factores del estilo de vida que pueden contribuir a la hipertensin.  Consulte a su mdico regularmente. Su mdico puede ayudarle a crear y [place] su plan para controlar la hipertensin. Debo tomar un medicamento para controlar mi presin arterial? El mdico puede recetarle medicamentos si los cambios en el estilo de vida no son suficientes para Museum/gallery curator la presin arterial y si:  Su presin arterial sistlica es de 130 o ms.  Su presin arterial diastlica es de 80 o ms. Tome los medicamentos solamente como se lo haya indicado el mdico. Siga cuidadosamente las indicaciones. Los medicamentos para la presin arterial deben tomarse segn las indicaciones. Los medicamentos pierden eficacia al omitir las dosis. El hecho de omitir las dosis tambin [place] el riesgo de otros problemas. Comunquese con un mdico si:  Piensa que tiene Runner, broadcasting/film/video a los medicamentos que ha tomado.  Tiene dolores de cabeza frecuentes (recurrentes).  Siente mareos.  Tiene hinchazn en los tobillos.  Tiene problemas de visin. Solicite ayuda de inmediato si:  Siente un dolor de cabeza intenso o confusin.  Siente debilidad inusual, adormecimiento o que  se desmayar.  Siente un dolor intenso en el pecho o el abdomen.  Vomita repetidas veces.  Tiene dificultad para respirar. Resumen  La hipertensin se produce cuando la sangre bombea en las arterias con mucha fuerza. Si esta afeccin no se controla, podra correr riesgo de tener complicaciones graves.  La presin arterial deseada puede variar en funcin de las enfermedades, la edad y otros factores personales. Para la Franklin Resourcesmayora de las personas, una presin arterial  normal es menor que 120/80.  La hipertensin se puede controlar mediante cambios en el estilo de vida, tomando medicamentos, o ambas cosas. Los Danaher Corporationcambios en el estilo de vida incluyen prdida de peso, ingerir alimentos sanos, seguir una dieta baja en sodio, hacer ms ejercicio y Glass blower/designerlimitar el consumo de alcohol. Esta informacin no tiene Theme park managercomo fin reemplazar el consejo del mdico. Asegrese de hacerle al mdico cualquier pregunta que tenga. Document Released: 05/09/2012 Document Revised: 10/24/2016 Document Reviewed: 07/27/2016 Elsevier Patient Education  2020 [employer]

## 2019-06-24 NOTE — Progress Notes (Signed)
10/26/20202:23 PM  Scott Best 12/14/1980, 38 y.o., male [id number]  Chief Complaint  Patient presents with   Hypertension    F/u     HPI:   Patient is a 38 y.o. male with past medical history significant for depression, vitamin D deficiency who presents today for evaluation of BP  Patient seen on Jun 07 2019 for paresthesias by Felton Clinton, NP BP 142/100 - told to come back for recheck PCP Dr Nolon Rod  Has been checking BP at home, ~ 130/85, he did have one reading of 141/99 (this was before starting pred) He drinks one cup of coffee a day Takes ibu about twice a week Started taking prednisone 3 days ago Denies decongestants Drinks etoh rarely Smokes very rarely, maybe a cigar a year Reports snoring, he has been told he stops breathing while sleeping Occasionally wakes up with headache Denies daytime somnolence Started taking wellbutrin about 2 months ago for depression - doing well He does have anxiety, getting worse  Lab Results  Component Value Date   TSH 2.220 02/13/2019    Depression screen [facility] 2/9 06/24/2019 06/07/2019 05/29/2019  Decreased Interest 0 0 0  Down, Depressed, Hopeless 0 0 0  PHQ - 2 Score 0 0 0  Altered sleeping - - -  Tired, decreased energy - - -  Change in appetite - - -  Feeling bad or failure about yourself  - - -  Trouble concentrating - - -  Moving slowly or fidgety/restless - - -  Suicidal thoughts - - -  PHQ-9 Score - - -  Difficult doing work/chores - - -    Fall Risk  06/24/2019 06/07/2019 05/29/2019 02/13/2019 01/13/2017  Falls in the past year? 0 0 0 0 No  Number falls in past yr: 0 0 0 0 -  Injury with Fall? 0 0 0 0 -  Follow up Falls evaluation completed Falls evaluation completed Falls evaluation completed Falls evaluation completed -     No Known Allergies  Prior to Admission medications   Medication Sig Start Date End Date Taking? Authorizing Provider  buPROPion (WELLBUTRIN SR) 150 MG 12 hr tablet Take 1 tablet (150  mg total) by mouth 2 (two) times daily. 05/29/19  Yes Stallings, Zoe A, MD  omeprazole (PRILOSEC) 20 MG capsule Take 20 mg by mouth daily.   Yes [provider]  predniSONE (DELTASONE) 10 MG tablet Take 3 tablets daily with breakfast. 06/21/19  Yes Jaynee Eagles, PA-C  terbinafine (LAMISIL) 1 % cream Apply 1 application topically 2 (two) times daily. Apply to clean dry feet. 02/13/19  Yes Forrest Moron, MD  Vitamin D, Ergocalciferol, (DRISDOL) 1.25 MG (50000 UT) CAPS capsule Take 1 capsule (50,000 Units total) by mouth every 7 (seven) days. 06/07/19 07/07/19 Yes Wendall Mola, NP  diclofenac (VOLTAREN) 75 MG EC tablet Take 1 tablet (75 mg total) by mouth 2 (two) times daily. Patient not taking: Reported on 06/24/2019 05/29/19   Forrest Moron, MD  meloxicam (MOBIC) 7.5 MG tablet Take 1 tablet (7.5 mg total) by mouth daily. Patient not taking: Reported on 06/24/2019 06/21/19   Jaynee Eagles, PA-C    Past Medical History:  Diagnosis Date   Allergy    Anxiety    Elevated BP without diagnosis of hypertension 06/07/2019   Numbness and tingling of left upper and lower extremity 06/07/2019   Vitamin D deficiency 06/07/2019    Past Surgical History:  Procedure Laterality Date   CHOLECYSTECTOMY  EYE SURGERY     R eye repair nail injury   HERNIA REPAIR      Social History   Tobacco Use   Smoking status: Former Smoker   Smokeless tobacco: Never Used  Substance Use Topics   Alcohol use: Yes    Alcohol/week: 14.0 standard drinks    Types: 14 Cans of beer per week    Family History  Problem Relation Age of Onset   Diabetes Mother    Heart disease Father    Cancer Maternal Grandfather    Cancer Paternal Grandmother     Review of Systems  Constitutional: Negative for chills and fever.  Respiratory: Negative for cough and shortness of breath.   Cardiovascular: Negative for chest pain, palpitations and leg swelling.  Gastrointestinal: Negative for abdominal  pain, nausea and vomiting.  Psychiatric/Behavioral: The patient is nervous/anxious.      OBJECTIVE:  Today's Vitals   06/24/19 1408 06/24/19 1410  BP: (!) 143/93 (!) 142/90  Pulse: 86   Temp: 99 F (37.2 C)   TempSrc: Oral   SpO2: 97%   Weight: 225 lb 12.8 oz (102.4 kg)   Height: 5\' 7"  (1.702 m)    Body mass index is 35.37 kg/m.   Physical Exam Vitals signs and nursing note reviewed.  Constitutional:      Appearance: He is well-developed.  HENT:     Head: Normocephalic and atraumatic.  Eyes:     Conjunctiva/sclera: Conjunctivae normal.     Pupils: Pupils are equal, round, and reactive to light.  Neck:     Musculoskeletal: Neck supple.  Cardiovascular:     Rate and Rhythm: Normal rate and regular rhythm.     Heart sounds: No murmur. No friction rub. No gallop.   Pulmonary:     Effort: Pulmonary effort is normal.     Breath sounds: Normal breath sounds. No wheezing or rales.  Skin:    General: Skin is warm and dry.  Neurological:     Mental Status: He is alert and oriented to person, place, and time.     No results found for this or any previous visit (from the past 24 hour(s)).  No results found.   ASSESSMENT and PLAN  1. Elevated BP without diagnosis of hypertension Discussed LFM, low salt, weight loss, eval for OSA, wean off wellbutrin, cont home BP monitoring, RTC precautions given  2. Snoring - Ambulatory referral to Sleep Studies  3. Anxiety and depression Changing wellbutrin to lexapro given presence of anxiety and recent raise in BP, discussed cross taper, reviewed r/se/b  Other orders - escitalopram (LEXAPRO) 20 MG tablet; Take 1 tablet (20 mg total) by mouth daily.  Return in about 6 weeks (around 08/05/2019).    14/02/2019, MD [facility] [address] [place], [place] Ph.  [phone] Fax [phone]

## 2019-07-21 NOTE — Progress Notes (Signed)
Established Patient Office Visit  Subjective:  Patient ID: Scott Best, male    DOB: 1980/12/26  Age: 38 y.o. MRN: [id number]  CC: No chief complaint on file.   HPI Scott Best presents for   Elevated BP: Patient here for follow-up of elevated blood pressure. He checked his home bp readings and it was 120/70s.  The previously weeks his bp was 130-140s/80s.  His medication Wellbutrin was switched for the Lexapro.  He states that he was still having anxiety on the Wellbutrin.  He feels less anxious.  He was also prednisone for a week around the time his blood pressure was high.  He denies family history of hypertension. His mother is diabetic.     BP Readings from Last 3 Encounters:  06/24/19 (!) 142/90  06/21/19 (!) 137/103  06/07/19 (!) 142/100   BP Readings from Last 3 Encounters:  07/22/19 132/80  06/24/19 (!) 142/90  06/21/19 (!) 137/103    Anxiety Pt medication changed from wellbutrin to lexapro by Dr. Leretha Pol Pt reports that his mood is much better today He feels less stress and anxiety He is working hard on a healthy life.  He is eating better and eating less. Wt Readings from Last 3 Encounters:  07/22/19 220 lb 12.8 oz (100.2 kg)  06/24/19 225 lb 12.8 oz (102.4 kg)  06/07/19 228 lb 3.2 oz (103.5 kg)   Depression screen [facility] 2/9 07/22/2019 06/24/2019 06/07/2019 05/29/2019 02/13/2019  Decreased Interest 0 0 0 0 2  Down, Depressed, Hopeless 0 0 0 0 1  PHQ - 2 Score 0 0 0 0 3  Altered sleeping - - - - 2  Tired, decreased energy - - - - 2  Change in appetite - - - - 1  Feeling bad or failure about yourself  - - - - 0  Trouble concentrating - - - - 1  Moving slowly or fidgety/restless - - - - 1  Suicidal thoughts - - - - 0  PHQ-9 Score - - - - 10  Difficult doing work/chores - - - - Somewhat difficult    Numbness and Tingling of the hand is gone.     Past Medical History:  Diagnosis Date  . Allergy   . Anxiety   . Elevated BP without diagnosis  of hypertension 06/07/2019  . Numbness and tingling of left upper and lower extremity 06/07/2019  . Vitamin D deficiency 06/07/2019    Past Surgical History:  Procedure Laterality Date  . CHOLECYSTECTOMY    . EYE SURGERY     R eye repair nail injury  . HERNIA REPAIR      Family History  Problem Relation Age of Onset  . Diabetes Mother   . Heart disease Father   . Cancer Maternal Grandfather   . Cancer Paternal Grandmother     Social History   Socioeconomic History  . Marital status: Single    Spouse name: Not on file  . Number of children: Not on file  . Years of education: Not on file  . Highest education level: Not on file  Occupational History  . Occupation: Corporate investment banker  Social Needs  . Financial resource strain: Not on file  . Food insecurity    Worry: Not on file    Inability: Not on file  . Transportation needs    Medical: Not on file    Non-medical: Not on file  Tobacco Use  . Smoking status: Former Games developer  . Smokeless tobacco: Never  Used  Substance and Sexual Activity  . Alcohol use: Yes    Alcohol/week: 14.0 standard drinks    Types: 14 Cans of beer per week  . Drug use: No  . Sexual activity: Not on file  Lifestyle  . Physical activity    Days per week: Not on file    Minutes per session: Not on file  . Stress: Not on file  Relationships  . Social Musicianconnections    Talks on phone: Not on file    Gets together: Not on file    Attends religious service: Not on file    Active member of club or organization: Not on file    Attends meetings of clubs or organizations: Not on file    Relationship status: Not on file  . Intimate partner violence    Fear of current or ex partner: Not on file    Emotionally abused: Not on file    Physically abused: Not on file    Forced sexual activity: Not on file  Other Topics Concern  . Not on file  Social History Narrative   Lives with wife in [place]    Outpatient Medications Prior to Visit   Medication Sig Dispense Refill  . escitalopram (LEXAPRO) 20 MG tablet Take 1 tablet (20 mg total) by mouth daily. 30 tablet 3  . omeprazole (PRILOSEC) 20 MG capsule Take 20 mg by mouth daily.    . predniSONE (DELTASONE) 10 MG tablet Take 3 tablets daily with breakfast. 15 tablet 0  . terbinafine (LAMISIL) 1 % cream Apply 1 application topically 2 (two) times daily. Apply to clean dry feet. 30 g 1   No facility-administered medications prior to visit.     No Known Allergies  ROS Review of Systems Review of Systems  Constitutional: Negative for activity change, appetite change, chills and fever.  HENT: Negative for congestion, nosebleeds, trouble swallowing and voice change.   Respiratory: Negative for cough, shortness of breath and wheezing.   Gastrointestinal: Negative for diarrhea, nausea and vomiting.  Genitourinary: Negative for difficulty urinating, dysuria, flank pain and hematuria.  Musculoskeletal: Negative for back pain, joint swelling and neck pain.  Neurological: Negative for dizziness, speech difficulty, light-headedness and numbness.  See HPI. All other review of systems negative.     Objective:    Physical Exam Vitals:   07/22/19 0851 07/22/19 0902 07/22/19 0911  BP: (!) 143/89 116/70 132/80  Pulse: 80    Temp: 98.5 F (36.9 C)    TempSrc: Oral    SpO2: 96%    Weight: 220 lb 12.8 oz (100.2 kg)    Height: 5\' 7"  (1.702 m)      There were no vitals taken for this visit. Wt Readings from Last 3 Encounters:  06/24/19 225 lb 12.8 oz (102.4 kg)  06/07/19 228 lb 3.2 oz (103.5 kg)  05/29/19 231 lb 3.2 oz (104.9 kg)   Physical Exam  Constitutional: Oriented to person, place, and time. Appears well-developed and well-nourished.  HENT:  Head: Normocephalic and atraumatic.  Eyes: Conjunctivae and EOM are normal.  Cardiovascular: Normal rate, regular rhythm, normal heart sounds and intact distal pulses.  No murmur heard. Pulmonary/Chest: Effort normal and breath  sounds normal. No stridor. No respiratory distress. Has no wheezes.  Neurological: Is alert and oriented to person, place, and time.  Skin: Skin is warm. Capillary refill takes less than 2 seconds.  Psychiatric: Has a normal mood and affect. Behavior is normal. Judgment and thought content normal.  Health Maintenance Due  Topic Date Due  . HIV Screening  04/27/1997  . INFLUENZA VACCINE  03/30/2019    There are no preventive care reminders to display for this patient.  Lab Results  Component Value Date   TSH 2.220 02/13/2019   Lab Results  Component Value Date   WBC 10.1 06/07/2019   HGB 14.6 06/07/2019   HCT 42.2 06/07/2019   MCV 92 06/07/2019   PLT 242 06/07/2019   Lab Results  Component Value Date   NA 143 06/07/2019   K 3.7 06/07/2019   CO2 20 06/07/2019   GLUCOSE 88 06/07/2019   BUN 11 06/07/2019   CREATININE 1.15 06/07/2019   BILITOT 0.4 06/07/2019   ALKPHOS 88 06/07/2019   AST 55 (H) 06/07/2019   ALT 57 (H) 06/07/2019   PROT 6.9 06/07/2019   ALBUMIN 4.7 06/07/2019   CALCIUM 9.0 06/07/2019   No results found for: CHOL No results found for: HDL No results found for: LDLCALC No results found for: TRIG No results found for: CHOLHDL No results found for: HGBA1C    Assessment & Plan:   Problem List Items Addressed This Visit      Other   Numbness and tingling of left upper and lower extremity (Chronic) - resolved   Elevated BP without diagnosis of hypertension (Chronic) - bp improved with management of stress and anxiety and with weight loss Likely this was situational Though he had the elevated blood pressures on separate visits these were also while on steroid prednisone    Other Visit Diagnoses    Anxiety and depression    -  Primary Improved on lexapro    Need for immunization against influenza       Relevant Orders   Flu Vaccine QUAD 36+ mos IM (Completed)       No orders of the defined types were placed in this encounter.   Follow-up:  No follow-ups on file.    Forrest Moron, MD

## 2019-07-22 ENCOUNTER — Other Ambulatory Visit: Payer: Self-pay

## 2019-07-22 ENCOUNTER — Ambulatory Visit (INDEPENDENT_AMBULATORY_CARE_PROVIDER_SITE_OTHER): Payer: Self-pay | Admitting: Family Medicine

## 2019-07-22 VITALS — BP 132/80 | HR 80 | Temp 98.5°F | Ht 67.0 in | Wt 220.8 lb

## 2019-07-22 DIAGNOSIS — R200 Anesthesia of skin: Secondary | ICD-10-CM

## 2019-07-22 DIAGNOSIS — Z23 Encounter for immunization: Secondary | ICD-10-CM

## 2019-07-22 DIAGNOSIS — R202 Paresthesia of skin: Secondary | ICD-10-CM

## 2019-07-22 DIAGNOSIS — F329 Major depressive disorder, single episode, unspecified: Secondary | ICD-10-CM

## 2019-07-22 DIAGNOSIS — F419 Anxiety disorder, unspecified: Secondary | ICD-10-CM

## 2019-07-22 DIAGNOSIS — R030 Elevated blood-pressure reading, without diagnosis of hypertension: Secondary | ICD-10-CM

## 2019-07-22 NOTE — Patient Instructions (Addendum)
If you have lab work done today you will be contacted with your lab results within the next 2 weeks.  If you have not heard from [place] then please contact us. The fastest way to get your results is to register for My Chart.   IF you received an x-ray today, you will receive an invoice from [facility]. Please contact [facility] at [phone] with questions or concerns regarding your invoice.   IF you received labwork today, you will receive an invoice from [facility]. Please contact [facility] at [phone] with questions or concerns regarding your invoice.   Our billing staff will not be able to assist you with questions regarding bills from these companies.  You will be contacted with the lab results as soon as they are available. The fastest way to get your results is to activate your My Chart account. Instructions are located on the last page of this paperwork. If you have not heard from [place] regarding the results in 2 weeks, please contact this office.      Cmo tomarse la presin arterial How to Take Your Blood Pressure La presin arterial es la medida de la fuerza de la sangre al presionar contra las paredes de las arterias. Las arterias son los vasos sanguneos que transportan la sangre desde el corazn hacia todas las partes del cuerpo. Su mdico toma su presin arterial en cada visita al consultorio. Usted tambin puede tomar su presin arterial en casa con un aparato de medicin de la presin arterial. Es posible que deba tomar su propia presin arterial:  Para confirmar un diagnstico de presin arterial elevada (hipertensin).  Para controlar su presin arterial a lo largo del tiempo.  Para asegurarse de que el medicamento para la presin arterial est surtiendo Engineer, mining. Materiales necesarios: Para tomar su presin arterial, necesitar un aparato de medicin de la presin arterial. Puede comprar un aparato de medicin de la presin arterial, o tensimetro, en  la mayora de las farmacias o en lnea. Existen varios tipos de tensimetros para Cabin crew. Al escoger uno, considere lo siguiente:  Escoja un tensimetro que tenga un brazalete.  Escoja un brazalete que envuelva ceidamente la parte superior de su brazo. Debe poder meter nicamente un dedo entre el brazalete y Cabin crew.  No escoja un tensimetro que mida su presin arterial en la mueca o el dedo. Su mdico puede sugerirle un tensimetro confiable que cumpla con sus necesidades. Cmo prepararse Para obtener la lectura ms precisa, evite realizar lo siguiente durante los 30 minutos previos a Chief Operating Officer su presin arterial:  Beber cafena.  Consumir alcohol.  Comer.  Fumar.  Realizar actividad fsica. Cinco minutos antes de controlar su presin arterial:  Vace la vejiga.  Sintese tranquilo en una silla de comedor y no en un silln blando o sof. Cmo tomarse la presin arterial Para controlar su presin arterial, siga las instrucciones presentes en el manual que se incluye con el tensimetro. Si tiene Ambulance person, las instrucciones podran ser las siguientes: 1. Sintese con la espalda recta. 2. Coloque los pies en el piso. No cruce los tobillos o las piernas. 3. Apoye su brazo izquierdo al nivel de su corazn en una mesa o escritorio, o en el brazo de la silla. 4. Arremnguese. 5. Envuelva la parte superior de su brazo izquierdo, 1 pulgada (2,5 cm) sobre su codo, con el brazalete. Es mejor Optometrist brazalete alrededor de la piel Jackson. 6. Ajuste el brazalete alrededor de su brazo.  Debe poder meter nicamente un dedo entre el brazalete y Water engineer. 7. Coloque el cable dentro del surco de su codo. 8. Presione el botn de encendido. 9. Permanezca sentado tranquilamente mientras el brazalete se infla y se desinfla. 10. Lea la lectura digital que aparece en la pantalla del tensimetro y antela (regstrela). 11. Espere de 2a3 minutos, y luego repita los pasos  desde el paso 1. Qu significa mi lectura de presin arterial? Una lectura de la presin arterial consta de un nmero ms alto sobre un nmero ms bajo. En condiciones ideales, la presin arterial debe estar por debajo de 120/80. El primer nmero ("superior") es la presin sistlica. Es la medida de la presin de las arterias cuando el corazn late. El segundo nmero ("inferior") es la presin diastlica. Es la medida de la presin en las arterias cuando el corazn se relaja. La presin arterial se clasifica en cuatro etapas. Las siguientes son las etapas para adultos que no tienen enfermedad grave de corto plazo o una afeccin crnica. La presin sistlica y la presin diastlica se miden en una unidad llamada mm Hg. Normal  Presin sistlica: por debajo de 881.  Presin diastlica: por debajo de 80. Elevada  Presin sistlica: 103-159.  Presin diastlica: por debajo de 80. Etapa 1 de hipertensin  Presin sistlica: 458-592.  Presin diastlica: 92-44. Etapa 2 de hipertensin  Presin sistlica: 628 o ms.  Presin diastlica: 90 o ms. Puede tener prehipertensin o hipertensin incluso si nicamente el nmero sistlico o el diastlico de su lectura es ms elevado que lo normal. Siga estas indicaciones en su casa:  Mida su presin arterial con la frecuencia recomendada por su mdico.  Lleve el tensimetro a su prxima cita con el mdico para asegurarse de lo siguiente: ? Que lo [place] correctamente. ? Que genera lecturas precisas.  Asegrese de entender cules son sus objetivos para la presin arterial.  Dgale a su mdico si tiene efectos secundarios causados por los medicamentos para la presin arterial. Comunquese con un mdico si:  Su presin arterial es sistemticamente alta. Solicite ayuda de inmediato si:  Su presin arterial sistlica est por encima de 180.  Su presin arterial diastlica est por encima de 110. Esta informacin no tiene Marine scientist el  consejo del mdico. Asegrese de hacerle al mdico cualquier pregunta que tenga. Document Released: 07/27/2016 Document Revised: 11/25/2017 Document Reviewed: 01/22/2016 Elsevier Patient Education  2020 [employer].

## 2019-11-10 ENCOUNTER — Other Ambulatory Visit: Payer: Self-pay | Admitting: Family Medicine

## 2019-11-10 NOTE — Telephone Encounter (Signed)
Requested Prescriptions  Pending Prescriptions Disp Refills  . escitalopram (LEXAPRO) 20 MG tablet [Pharmacy Med Name: ESCITALOPRAM 20MG  TABLETS] 30 tablet 3    Sig: TAKE 1 TABLET(20 MG) BY MOUTH DAILY     Psychiatry:  Antidepressants - SSRI Passed - 11/10/2019  9:04 AM      Passed - Valid encounter within last 6 months    Recent Outpatient Visits          3 months ago Anxiety and depression   Primary Care at [facility], [facility] A, MD   4 months ago Elevated BP without diagnosis of hypertension   Primary Care at [place], Oneita Jolly, MD   5 months ago Numbness and tingling   [facility] at [facility], [facility], NP   5 months ago Current 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 episode [facility])   Primary Care at [facility], Zoe A, MD   9 months ago Current 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 episode [facility])   Primary Care at [facility], [facility], MD      Future Appointments            In 1 week Manus Rudd, MD Primary Care at [place], [facility]

## 2019-11-18 ENCOUNTER — Other Ambulatory Visit: Payer: Self-pay

## 2019-11-18 ENCOUNTER — Ambulatory Visit (INDEPENDENT_AMBULATORY_CARE_PROVIDER_SITE_OTHER): Payer: Self-pay | Admitting: Family Medicine

## 2019-11-18 ENCOUNTER — Encounter: Payer: Self-pay | Admitting: Family Medicine

## 2019-11-18 VITALS — BP 134/85 | HR 76 | Temp 98.3°F | Resp 17 | Ht 67.0 in | Wt 231.0 lb

## 2019-11-18 DIAGNOSIS — F411 Generalized anxiety disorder: Secondary | ICD-10-CM

## 2019-11-18 DIAGNOSIS — Z13220 Encounter for screening for lipoid disorders: Secondary | ICD-10-CM

## 2019-11-18 DIAGNOSIS — B351 Tinea unguium: Secondary | ICD-10-CM

## 2019-11-18 DIAGNOSIS — R030 Elevated blood-pressure reading, without diagnosis of hypertension: Secondary | ICD-10-CM

## 2019-11-18 MED ORDER — TERBINAFINE HCL 250 MG PO TABS: 250.0000 mg | ORAL_TABLET | Freq: Every day | ORAL | 0 refills | Status: DC

## 2019-11-18 NOTE — Progress Notes (Signed)
Established Patient Office Visit  Subjective:  Patient ID: Scott Best, male    DOB: Nov 06, 1980  Age: 39 y.o. MRN: [id number]  CC:  Chief Complaint  Patient presents with  . anixiety and blood pressure    4 month recheck    HPI Scott Best presents for   Anxiety He stopped the lexapro 2 weeks ago  Due to his anxiety improving he stopped the medication He is exercising very little He noted that he has gained weight and he feels it Wt Readings from Last 3 Encounters:  11/18/19 231 lb 0.6 oz (104.8 kg)  07/22/19 220 lb 12.8 oz (100.2 kg)  06/24/19 225 lb 12.8 oz (102.4 kg)   Nail fungus He took the lamisil pills but only had enough medication for 2 weeks He states that his nail is not improving with the creme  Obesity Pt is not exercising much but can feel the weight gain No chest pains or shortness of breath He is beginning to cut calories  Body mass index is 36.19 kg/m.   Past Medical History:  Diagnosis Date  . Allergy   . Anxiety   . Elevated BP without diagnosis of hypertension 06/07/2019  . Numbness and tingling of left upper and lower extremity 06/07/2019  . Vitamin D deficiency 06/07/2019    Past Surgical History:  Procedure Laterality Date  . CHOLECYSTECTOMY    . EYE SURGERY     R eye repair nail injury  . HERNIA REPAIR      Family History  Problem Relation Age of Onset  . Diabetes Mother   . Heart disease Father   . Cancer Maternal Grandfather   . Cancer Paternal Grandmother     Social History   Socioeconomic History  . Marital status: Single    Spouse name: Not on file  . Number of children: Not on file  . Years of education: Not on file  . Highest education level: Not on file  Occupational History  . Occupation: Corporate investment banker  Tobacco Use  . Smoking status: Former Games developer  . Smokeless tobacco: Never Used  Substance and Sexual Activity  . Alcohol use: Yes    Alcohol/week: 14.0 standard drinks    Types: 14 Cans  of beer per week  . Drug use: No  . Sexual activity: Not on file  Other Topics Concern  . Not on file  Social History Narrative   Lives with wife in [place]   Social Determinants of Health   Financial Resource Strain:   . Difficulty of Paying Living Expenses:   Food Insecurity:   . Worried About Programme researcher, broadcasting/film/video in the Last Year:   . Barista in the Last Year:   Transportation Needs:   . Freight forwarder (Medical):   Marland Kitchen Lack of Transportation (Non-Medical):   Physical Activity:   . Days of Exercise per Week:   . Minutes of Exercise per Session:   Stress:   . Feeling of Stress :   Social Connections:   . Frequency of Communication with Friends and Family:   . Frequency of Social Gatherings with Friends and Family:   . Attends Religious Services:   . Active Member of Clubs or Organizations:   . Attends Banker Meetings:   Marland Kitchen Marital Status:   Intimate Partner Violence:   . Fear of Current or Ex-Partner:   . Emotionally Abused:   Marland Kitchen Physically Abused:   . Sexually Abused:  Outpatient Medications Prior to Visit  Medication Sig Dispense Refill  . escitalopram (LEXAPRO) 20 MG tablet TAKE 1 TABLET(20 MG) BY MOUTH DAILY 30 tablet 3  . omeprazole (PRILOSEC) 20 MG capsule Take 20 mg by mouth daily.    . predniSONE (DELTASONE) 10 MG tablet Take 3 tablets daily with breakfast. (Patient not taking: Reported on 11/18/2019) 15 tablet 0  . terbinafine (LAMISIL) 1 % cream Apply 1 application topically 2 (two) times daily. Apply to clean dry feet. (Patient not taking: Reported on 11/18/2019) 30 g 1   No facility-administered medications prior to visit.    No Known Allergies  ROS Review of Systems Review of Systems  Constitutional: Negative for activity change, appetite change, chills and fever.  HENT: Negative for congestion, nosebleeds, trouble swallowing and voice change.   Respiratory: Negative for cough, shortness of breath and wheezing.     Gastrointestinal: Negative for diarrhea, nausea and vomiting.  Genitourinary: Negative for difficulty urinating, dysuria, flank pain and hematuria.  Musculoskeletal: Negative for back pain, joint swelling and neck pain.  Neurological: Negative for dizziness, speech difficulty, light-headedness and numbness.  See HPI. All other review of systems negative.     Objective:    Physical Exam  BP 134/85 (BP Location: Right Arm, Patient Position: Sitting, Cuff Size: Large)   Pulse 76   Temp 98.3 F (36.8 C) (Temporal)   Resp 17   Ht 5\' 7"  (1.702 m)   Wt 231 lb 0.6 oz (104.8 kg)   SpO2 97%   BMI 36.19 kg/m  Wt Readings from Last 3 Encounters:  11/18/19 231 lb 0.6 oz (104.8 kg)  07/22/19 220 lb 12.8 oz (100.2 kg)  06/24/19 225 lb 12.8 oz (102.4 kg)   Physical Exam  Constitutional: Oriented to person, place, and time. Appears well-developed and well-nourished.  HENT:  Head: Normocephalic and atraumatic.  Eyes: Conjunctivae and EOM are normal.  Cardiovascular: Normal rate, regular rhythm, normal heart sounds and intact distal pulses.  No murmur heard. Pulmonary/Chest: Effort normal and breath sounds normal. No stridor. No respiratory distress. Has no wheezes.  Neurological: Is alert and oriented to person, place, and time.  Skin: Skin is warm. Capillary refill takes less than 2 seconds.  Psychiatric: Has a normal mood and affect. Behavior is normal. Judgment and thought content normal.    Health Maintenance Due  Topic Date Due  . HIV Screening  Never done    There are no preventive care reminders to display for this patient.  Lab Results  Component Value Date   TSH 2.220 02/13/2019   Lab Results  Component Value Date   WBC 10.1 06/07/2019   HGB 14.6 06/07/2019   HCT 42.2 06/07/2019   MCV 92 06/07/2019   PLT 242 06/07/2019   Lab Results  Component Value Date   NA 143 06/07/2019   K 3.7 06/07/2019   CO2 20 06/07/2019   GLUCOSE 88 06/07/2019   BUN 11 06/07/2019    CREATININE 1.15 06/07/2019   BILITOT 0.4 06/07/2019   ALKPHOS 88 06/07/2019   AST 55 (H) 06/07/2019   ALT 57 (H) 06/07/2019   PROT 6.9 06/07/2019   ALBUMIN 4.7 06/07/2019   CALCIUM 9.0 06/07/2019   No results found for: CHOL No results found for: HDL No results found for: LDLCALC No results found for: TRIG No results found for: CHOLHDL No results found for: 08/07/2019    Assessment & Plan:   Problem List Items Addressed This Visit      Other  Elevated BP without diagnosis of hypertension - Primary (Chronic)  - anxiety improved, bp is good Advised weight loss     Other Visit Diagnoses    Screening for cholesterol level       Relevant Orders   Lipid panel   Onychomycosis    -  Advised to return in 3 months for lab visit   Relevant Medications   terbinafine (LAMISIL) 250 MG tablet   Other Relevant Orders   Hepatic Function Panel    Anxiety state - improved, off lexapro now  Meds ordered this encounter  Medications  . terbinafine (LAMISIL) 250 MG tablet    Sig: Take 1 tablet (250 mg total) by mouth daily.    Dispense:  90 tablet    Refill:  0    Follow-up: Return in about 3 months (around 02/18/2020) for lab visit for liver function test.    Forrest Moron, MD

## 2019-11-18 NOTE — Patient Instructions (Addendum)
If you have lab work done today you will be contacted with your lab results within the next 2 weeks.  If you have not heard from [place] then please contact us. The fastest way to get your results is to register for My Chart.   IF you received an x-ray today, you will receive an invoice from [facility]. Please contact [facility] at [phone] with questions or concerns regarding your invoice.   IF you received labwork today, you will receive an invoice from [facility]. Please contact [facility] at [phone] with questions or concerns regarding your invoice.   Our billing staff will not be able to assist you with questions regarding bills from these companies.  You will be contacted with the lab results as soon as they are available. The fastest way to get your results is to activate your My Chart account. Instructions are located on the last page of this paperwork. If you have not heard from [place] regarding the results in 2 weeks, please contact this office.     Lipid Profile Test Why am I having this test? The lipid profile test can be used to help evaluate your risk for developing heart disease. The test is also used to monitor your levels during treatment for high cholesterol to see if you are reaching your goals. What is being tested? A lipid profile measures the following:  Total cholesterol. Cholesterol is a waxy, fat-like substance in your blood. If your total cholesterol level is high, this can increase your risk for heart disease.  High-density lipoprotein (HDL). This is known as the good cholesterol. Having high levels of HDL decreases your risk for heart disease. Your HDL level may be low if you smoke or do not get enough exercise.  Low-density lipoprotein (LDL). This is known as the bad cholesterol. This type causes plaque to build up in your arteries. Having a low level of LDL is best. Having high levels of LDL increases your risk for heart  disease.  Cholesterol to HDL ratio. This is calculated by dividing your total cholesterol by your HDL cholesterol. The ratio is used by health care providers to determine your risk for heart disease. A low ratio is best.  Triglycerides. These are fats that your body can store or burn for energy. Low levels are best. Having high levels of triglycerides increases your risk for heart disease. What kind of sample is taken?  A blood sample is required for this test. It is usually collected by inserting a needle into a blood vessel. How do I prepare for this test? Do not drink alcohol starting at least 24 hours before your test. Follow any instructions from your health care provider about dietary restrictions before your test. Do not eat or drink anything other than water after midnight on the night before the test, or as told by your health care provider. Tell a health care provider about:  All medicines you are taking, including vitamins, herbs, eye drops, creams, and over-the-counter medicines.  Any medical conditions you have.  Whether you are pregnant or may be pregnant. How are the results reported? Your test results will be reported as values that indicate your cholesterol and triglyceride levels. Your health care provider will compare your results to normal ranges that were established after testing a large group of people (reference ranges). Reference ranges may vary among labs and hospitals. For this test, common reference ranges are: Total cholesterol  Adult or elderly: less than 200 mg/dL.  Child: 120-200 mg/dL.  Infant: 70-175 mg/dL.  Newborn: 53-135 mg/dL. HDL  Male: greater than 45 mg/dL.  Male: greater than 55 mg/dL. HDL reference values based on your risk for heart disease:  Low risk for heart disease: ? Male: 60 mg/dL. ? Male: 70 mg/dL.  Moderate risk for heart disease: ? Male: 45 mg/dL. ? Male: 55 mg/dL.  High risk for heart disease: ? Male: 25  mg/dL. ? Male: 35 mg/dL. LDL  Adults: Your health care provider will determine a target level for LDL based on your risk for heart disease. ? If you are at low risk, your LDL should be 130 mg/dL or less. ? If you are at moderate risk, your LDL should be 100 mg/dL or less. ? If you are at high risk, your LDL should be 70 mg/dL or less.  Children: less than 110 mg/dL. Cholesterol to HDL ratio Reference values based on your risk for heart disease:  Risk that is half the average risk: ? Male: 3.4. ? Male: 3.3.  Average risk: ? Male: 5.0. ? Male: 4.4.  Risk that is two times average (moderate risk): ? Male: 10.0. ? Male: 7.0.  Risk that is three times average (high risk): ? Male: 24.0. ? Male: 11.0. Triglycerides  Adult or elderly: ? Male: 40-160 mg/dL. ? Male: 35-135 mg/dL.  Children 39-23 years old: ? Male: 40-163 mg/dL. ? Male: 40-128 mg/dL.  Children 45-55 years old: ? Male: 36-138 mg/dL. ? Male: 41-138 mg/dL.  Children 25-61 years old: ? Male: 31-108 mg/dL. ? Male: 35-114 mg/dL.  Children 55-42 years old: ? Male: 30-86 mg/dL. ? Male: 32-99 mg/dL. Triglycerides should be less than 400 mg/dL even when you are not fasting. What do the results mean? Results that are within the reference ranges are considered normal. Total cholesterol, LDL, and triglyceride levels that are higher than the reference ranges can mean that you have an increased risk for heart disease. An HDL level that is lower than the reference range can also indicate an increased risk. Talk with your health care provider about what your results mean. Questions to ask your health care provider Ask your health care provider, or the department that is doing the test:  When will my results be ready?  How will I get my results?  What are my treatment options?  What other tests do I need?  What are my next steps? Summary  The lipid profile test can be used to help predict the  likelihood that you will develop heart disease. It can also help monitor your cholesterol levels during treatment.  A lipid profile measures your total cholesterol, high-density lipoprotein (HDL), low-density lipoprotein (LDL), cholesterol to HDL ratio, and triglycerides.  Total cholesterol, LDL, and triglyceride levels that are higher than the reference ranges can indicate an increased risk for heart disease.  An HDL level that is lower than the reference range can indicate an increased risk for heart disease.  Talk with your health care provider about what your results mean. This information is not intended to replace advice given to you by your health care provider. Make sure you discuss any questions you have with your health care provider. Document Revised: 05/22/2017 Document Reviewed: 05/22/2017 Elsevier Patient Education  2020 [employer].

## 2019-11-20 LAB — HEPATIC FUNCTION PANEL
ALT: 49 IU/L — ABNORMAL HIGH (ref 0–44)
AST: 33 IU/L (ref 0–40)
Albumin: 4.7 g/dL (ref 4.0–5.0)
Alkaline Phosphatase: 92 IU/L (ref 39–117)
Bilirubin Total: 0.2 mg/dL (ref 0.0–1.2)
Bilirubin, Direct: 0.07 mg/dL (ref 0.00–0.40)
Total Protein: 7.3 g/dL (ref 6.0–8.5)

## 2019-11-20 LAB — LIPID PANEL
Chol/HDL Ratio: 5.5 ratio — ABNORMAL HIGH (ref 0.0–5.0)
Cholesterol, Total: 213 mg/dL — ABNORMAL HIGH (ref 100–199)
HDL: 39 mg/dL — ABNORMAL LOW (ref >39)
LDL Chol Calc (NIH): 114 mg/dL — ABNORMAL HIGH (ref 0–99)
Triglycerides: 344 mg/dL — ABNORMAL HIGH (ref 0–149)
VLDL Cholesterol Cal: 60 mg/dL — ABNORMAL HIGH (ref 5–40)

## 2020-02-18 ENCOUNTER — Other Ambulatory Visit: Payer: Self-pay

## 2020-02-18 ENCOUNTER — Ambulatory Visit (INDEPENDENT_AMBULATORY_CARE_PROVIDER_SITE_OTHER): Payer: Self-pay | Admitting: Emergency Medicine

## 2020-02-18 DIAGNOSIS — R899 Unspecified abnormal finding in specimens from other organs, systems and tissues: Secondary | ICD-10-CM

## 2020-02-18 DIAGNOSIS — Z13220 Encounter for screening for lipoid disorders: Secondary | ICD-10-CM

## 2020-02-18 NOTE — Progress Notes (Signed)
Pt has an appt to est care on 05/21/2020, Dr Creta Levin was following this pts liver enzymes today was due for repeat.

## 2020-02-19 LAB — LIPID PANEL
Chol/HDL Ratio: 5.6 ratio — ABNORMAL HIGH (ref 0.0–5.0)
Cholesterol, Total: 218 mg/dL — ABNORMAL HIGH (ref 100–199)
HDL: 39 mg/dL — ABNORMAL LOW (ref >39)
LDL Chol Calc (NIH): 123 mg/dL — ABNORMAL HIGH (ref 0–99)
Triglycerides: 319 mg/dL — ABNORMAL HIGH (ref 0–149)
VLDL Cholesterol Cal: 56 mg/dL — ABNORMAL HIGH (ref 5–40)

## 2020-02-19 LAB — HEPATIC FUNCTION PANEL
ALT: 28 IU/L (ref 0–44)
AST: 25 IU/L (ref 0–40)
Albumin: 4.8 g/dL (ref 4.0–5.0)
Alkaline Phosphatase: 82 IU/L (ref 48–121)
Bilirubin Total: 0.5 mg/dL (ref 0.0–1.2)
Bilirubin, Direct: 0.12 mg/dL (ref 0.00–0.40)
Total Protein: 7 g/dL (ref 6.0–8.5)

## 2020-03-02 ENCOUNTER — Ambulatory Visit (HOSPITAL_COMMUNITY)
Admission: EM | Admit: 2020-03-02 | Discharge: 2020-03-02 | Disposition: A | Discharge status: Home / Self Care | Payer: Self-pay | Attending: Family Medicine | Admitting: Family Medicine

## 2020-03-02 ENCOUNTER — Other Ambulatory Visit: Payer: Self-pay

## 2020-03-02 DIAGNOSIS — R1010 Upper abdominal pain, unspecified: Secondary | ICD-10-CM

## 2020-03-02 DIAGNOSIS — R197 Diarrhea, unspecified: Secondary | ICD-10-CM

## 2020-03-02 MED ORDER — DICYCLOMINE HCL 20 MG PO TABS: 20.0000 mg | ORAL_TABLET | Freq: Three times a day (TID) | ORAL | 0 refills | Status: DC

## 2020-03-02 NOTE — ED Triage Notes (Signed)
Pt c/o acute generalized abdom pain, diarrhea since Friday afternoon, states ate at a restaurant and states his work partner had similar symptoms after eating at [employer] as well. Pt reports no diarrhea today, but abdom discomfort continues, subjective fever, fatigue as well.  Denies sore throat, congestion, runny nose, cough, SOB HA, dysuria symptoms.   Tolerating po fluids.

## 2020-03-02 NOTE — Discharge Instructions (Signed)
Your nausea, vomiting, and diarrhea appear to have a viral cause. Your symptoms should improve over the next few days as your body continues to rid the infectious cause.  May try bentyl/dicyclomine before meals and bedtime to help with pain. Start with clear liquids, then move to plain foods like bananas, rice, applesauce, toast, broth, grits, oatmeal. As those food settle okay you may transition to your normal foods. Avoid spicy and greasy foods as much as possible.  For Diarrhea: This is your body's natural way of getting rid of a virus. You may try taking 1 imodium to decrease amount of stools a day, but we do not want you to stop your diarrhea.   Preventing dehydration is key! You need to replace the fluid your body is expelling. Drink plenty of fluids, may use Pedialyte or sports drinks.   Please return if you are experiencing blood in your vomit or stool or experiencing dizziness, lightheadedness, extreme fatigue, increased abdominal pain.

## 2020-03-04 NOTE — ED Provider Notes (Signed)
[facility]    CSN: [id number] Arrival date & time: 03/02/20  1828      History   Chief Complaint Chief Complaint  Patient presents with  . Abdominal Pain  . Diarrhea    HPI Scott Best is a 39 y.o. male history of prior cholecystectomy presenting today for evaluation of abdominal pain and diarrhea.  Patient reports that he has had some generalized abdominal pain and looser more frequent bowels since Friday.  Symptoms persisting over the past 4 days.  Symptoms began after eating at a restaurant and coworker has had similar symptoms.  Diarrhea has been decreasing in frequency, but has continued to have some abdominal discomfort.  He denies any nausea or vomiting.  Has been tolerating fluids.  Main pain has been in the upper abdomen and slightly towards the right.  He denies blood in the stool.  Has some alcohol use on weekends, but no regular use.  Denies significant use of NSAIDs.  Denies chest pain or shortness of breath.  HPI  Past Medical History:  Diagnosis Date  . Allergy   . Anxiety   . Elevated BP without diagnosis of hypertension 06/07/2019  . Numbness and tingling of left upper and lower extremity 06/07/2019  . Vitamin D deficiency 06/07/2019    Patient Active Problem List   Diagnosis Date Noted  . Numbness and tingling of left upper and lower extremity 06/07/2019  . Elevated BP without diagnosis of hypertension 06/07/2019  . Vitamin D deficiency 06/07/2019  . Viral illness 12/02/2016  . Chills 12/02/2016  . Flu-like symptoms 12/02/2016    Past Surgical History:  Procedure Laterality Date  . CHOLECYSTECTOMY    . EYE SURGERY     R eye repair nail injury  . HERNIA REPAIR         Home Medications    Prior to Admission medications   Medication Sig Start Date End Date Taking? Authorizing Provider  omeprazole (PRILOSEC) 20 MG capsule Take 20 mg by mouth daily.   Yes [provider]  dicyclomine (BENTYL) 20 MG tablet Take 1 tablet (20  mg total) by mouth 4 (four) times daily -  before meals and at bedtime. 03/02/20   Madsen Riddle C, PA-C  terbinafine (LAMISIL) 1 % cream Apply 1 application topically 2 (two) times daily. Apply to clean dry feet. Patient not taking: Reported on 11/18/2019 02/13/19   Doristine Bosworth, MD  terbinafine (LAMISIL) 250 MG tablet Take 1 tablet (250 mg total) by mouth daily. 11/18/19   Doristine Bosworth, MD    Family History Family History  Problem Relation Age of Onset  . Diabetes Mother   . Heart disease Father   . Cancer Maternal Grandfather   . Cancer Paternal Grandmother     Social History Social History   Tobacco Use  . Smoking status: Former Games developer  . Smokeless tobacco: Never Used  Vaping Use  . Vaping Use: Never used  Substance Use Topics  . Alcohol use: Yes    Alcohol/week: 14.0 standard drinks    Types: 14 Cans of beer per week  . Drug use: No     Allergies   Patient has no known allergies.   Review of Systems Review of Systems  Constitutional: Negative for activity change, appetite change, chills, fatigue and fever.  HENT: Negative for congestion, ear pain, rhinorrhea, sinus pressure, sore throat and trouble swallowing.   Eyes: Negative for discharge and redness.  Respiratory: Negative for cough, chest tightness and  shortness of breath.   Cardiovascular: Negative for chest pain.  Gastrointestinal: Positive for abdominal pain. Negative for diarrhea, nausea and vomiting.  Musculoskeletal: Negative for myalgias.  Skin: Negative for rash.  Neurological: Negative for dizziness, light-headedness and headaches.     Physical Exam Triage Vital Signs ED Triage Vitals  Enc Vitals Group     BP 03/02/20 2001 124/82     Pulse Rate 03/02/20 2001 78     Resp 03/02/20 2001 18     Temp 03/02/20 2001 98.6 F (37 C)     Temp Source 03/02/20 2001 Oral     SpO2 03/02/20 2001 100 %     Weight --      Height --      Head Circumference --      Peak Flow --      Pain Score  03/02/20 1958 6     Pain Loc --      Pain Edu? --      Excl. in GC? --    No data found.  Updated Vital Signs BP 124/82 (BP Location: Right Arm)   Pulse 78   Temp 98.6 F (37 C) (Oral)   Resp 18   SpO2 100%   Visual Acuity Right Eye Distance:   Left Eye Distance:   Bilateral Distance:    Right Eye Near:   Left Eye Near:    Bilateral Near:     Physical Exam Vitals and nursing note reviewed.  Constitutional:      Appearance: He is well-developed.     Comments: No acute distress  HENT:     Head: Normocephalic and atraumatic.     Nose: Nose normal.     Mouth/Throat:     Comments: Oral mucosa pink and moist, no tonsillar enlargement or exudate. Posterior pharynx patent and nonerythematous, no uvula deviation or swelling. Normal phonation.  Eyes:     Conjunctiva/sclera: Conjunctivae normal.  Cardiovascular:     Rate and Rhythm: Normal rate.  Pulmonary:     Effort: Pulmonary effort is normal. No respiratory distress.     Comments: Breathing comfortably at rest, CTABL, no wheezing, rales or other adventitious sounds auscultated  Abdominal:     General: There is no distension.     Comments: Soft, nondistended, tender to palpation in right upper quadrant and epigastrium/mid upper abdomen, nontender to bilateral lower quadrants, negative rebound  Musculoskeletal:        General: Normal range of motion.     Cervical back: Neck supple.  Skin:    General: Skin is warm and dry.  Neurological:     Mental Status: He is alert and oriented to person, place, and time.      UC Treatments / Results  Labs (all labs ordered are listed, but only abnormal results are displayed) Labs Reviewed - No data to display  EKG   Radiology No results found.  Procedures Procedures (including critical care time)  Medications Ordered in UC Medications - No data to display  Initial Impression / Assessment and Plan / UC Course  I have reviewed the triage vital signs and the nursing  notes.  Pertinent labs & imaging results that were available during my care of the patient were reviewed by me and considered in my medical decision making (see chart for details).     Abdominal discomfort with associated diarrhea, prior cholecystectomy, vital signs stable.  Low suspicion of acute abdominal emergency, negative peritoneal signs.  Recommending symptomatic and supportive care and treatment  for viral gastroenteritis given coworker with similar symptoms.  Rest and fluids.  May try Bentyl for pain.  Monitor for gradual resolution.  Discussed strict return precautions. Patient verbalized understanding and is agreeable with plan.  Final Clinical Impressions(s) / UC Diagnoses   Final diagnoses:  Pain of upper abdomen  Diarrhea of presumed infectious origin     Discharge Instructions     Your nausea, vomiting, and diarrhea appear to have a viral cause. Your symptoms should improve over the next few days as your body continues to rid the infectious cause.  May try bentyl/dicyclomine before meals and bedtime to help with pain. Start with clear liquids, then move to plain foods like bananas, rice, applesauce, toast, broth, grits, oatmeal. As those food settle okay you may transition to your normal foods. Avoid spicy and greasy foods as much as possible.  For Diarrhea: This is your body's natural way of getting rid of a virus. You may try taking 1 imodium to decrease amount of stools a day, but we do not want you to stop your diarrhea.   Preventing dehydration is key! You need to replace the fluid your body is expelling. Drink plenty of fluids, may use Pedialyte or sports drinks.   Please return if you are experiencing blood in your vomit or stool or experiencing dizziness, lightheadedness, extreme fatigue, increased abdominal pain.    ED Prescriptions    Medication Sig Dispense Auth. Provider   dicyclomine (BENTYL) 20 MG tablet Take 1 tablet (20 mg total) by mouth 4 (four) times  daily -  before meals and at bedtime. 20 tablet Derran Sear, [place] C, PA-C     PDMP not reviewed this encounter.   Lew Dawes, [place] 03/04/20 1749

## 2020-03-08 ENCOUNTER — Ambulatory Visit (HOSPITAL_COMMUNITY)
Admission: EM | Admit: 2020-03-08 | Discharge: 2020-03-08 | Disposition: A | Discharge status: Home / Self Care | Payer: Self-pay | Attending: Family Medicine | Admitting: Family Medicine

## 2020-03-08 ENCOUNTER — Encounter (HOSPITAL_COMMUNITY): Payer: Self-pay

## 2020-03-08 ENCOUNTER — Other Ambulatory Visit: Payer: Self-pay

## 2020-03-08 DIAGNOSIS — R1011 Right upper quadrant pain: Secondary | ICD-10-CM

## 2020-03-08 NOTE — Discharge Instructions (Addendum)
I would have you follow up with GI  I put in an ambulatory referral  May take ibuprofen or tylenol for pain  Attached information about pancreatitis, cannot rule that out in this office

## 2020-03-08 NOTE — ED Triage Notes (Signed)
Pt presents for follow up for continued RUQ abdominal pain for over a week.

## 2020-03-09 ENCOUNTER — Encounter: Payer: Self-pay | Admitting: Nurse Practitioner

## 2020-03-10 NOTE — ED Provider Notes (Signed)
[facility]   [id number] 03/08/20 Arrival Time: 1422  CC: ABDOMINAL PAIN  SUBJECTIVE:  Scott Best is a 39 y.o. male who presents with complaint of abdominal discomfort that began 2 weeks ago.  Denies a precipitating event, trauma, close contacts with similar symptoms, recent travel or antibiotic use.  Localizes pain to right upper quadrant.  Describes as intermittent and sharp in character.  Patient has history of cholecystectomy.  Has taken ibuprofen with some improvement.  He reports that the pain is sometimes aggravated with food. Reports similar symptoms in the past with his cholecystitis  Denies fever, chills, appetite changes, weight changes, nausea, vomiting, chest pain, SOB, diarrhea, constipation, hematochezia, melena, dysuria, difficulty urinating, increased frequency or urgency, flank pain, loss of bowel or bladder function  No LMP for male patient.  ROS: As per HPI.  All other pertinent ROS negative.     Past Medical History:  Diagnosis Date  . Allergy   . Anxiety   . Elevated BP without diagnosis of hypertension 06/07/2019  . Numbness and tingling of left upper and lower extremity 06/07/2019  . Vitamin D deficiency 06/07/2019   Past Surgical History:  Procedure Laterality Date  . CHOLECYSTECTOMY    . EYE SURGERY     R eye repair nail injury  . HERNIA REPAIR     No Known Allergies No current facility-administered medications on file prior to encounter.   Current Outpatient Medications on File Prior to Encounter  Medication Sig Dispense Refill  . dicyclomine (BENTYL) 20 MG tablet Take 1 tablet (20 mg total) by mouth 4 (four) times daily -  before meals and at bedtime. 20 tablet 0  . omeprazole (PRILOSEC) 20 MG capsule Take 20 mg by mouth daily.    Marland Kitchen terbinafine (LAMISIL) 1 % cream Apply 1 application topically 2 (two) times daily. Apply to clean dry feet. (Patient not taking: Reported on 11/18/2019) 30 g 1  . terbinafine (LAMISIL) 250 MG tablet Take  1 tablet (250 mg total) by mouth daily. 90 tablet 0   Social History   Socioeconomic History  . Marital status: Single    Spouse name: Not on file  . Number of children: Not on file  . Years of education: Not on file  . Highest education level: Not on file  Occupational History  . Occupation: Corporate investment banker  Tobacco Use  . Smoking status: Former Games developer  . Smokeless tobacco: Never Used  Vaping Use  . Vaping Use: Never used  Substance and Sexual Activity  . Alcohol use: Yes    Alcohol/week: 14.0 standard drinks    Types: 14 Cans of beer per week  . Drug use: No  . Sexual activity: Not on file  Other Topics Concern  . Not on file  Social History Narrative   Lives with wife in [place]   Social Determinants of Health   Financial Resource Strain:   . Difficulty of Paying Living Expenses:   Food Insecurity:   . Worried About Programme researcher, broadcasting/film/video in the Last Year:   . Barista in the Last Year:   Transportation Needs:   . Freight forwarder (Medical):   Marland Kitchen Lack of Transportation (Non-Medical):   Physical Activity:   . Days of Exercise per Week:   . Minutes of Exercise per Session:   Stress:   . Feeling of Stress :   Social Connections:   . Frequency of Communication with Friends and Family:   . Frequency of  Social Gatherings with Friends and Family:   . Attends Religious Services:   . Active Member of Clubs or Organizations:   . Attends Banker Meetings:   Marland Kitchen Marital Status:   Intimate Partner Violence:   . Fear of Current or Ex-Partner:   . Emotionally Abused:   Marland Kitchen Physically Abused:   . Sexually Abused:    Family History  Problem Relation Age of Onset  . Diabetes Mother   . Heart disease Father   . Cancer Maternal Grandfather   . Cancer Paternal Grandmother      OBJECTIVE:  Vitals:   03/08/20 1550  BP: 140/87  Pulse: 68  Resp: 17  Temp: 98 F (36.7 C)  TempSrc: Oral  SpO2: 100%    General appearance: Alert; NAD HEENT:  NCAT.  Oropharynx clear.  Lungs: clear to auscultation bilaterally without adventitious breath sounds Heart: regular rate and rhythm.  Radial pulses 2+ symmetrical bilaterally Abdomen: soft, non-distended; normal active bowel sounds; tender to deep palpation in the right upper quadrant; nontender at McBurney's point; negative Murphy's sign; negative rebound; no guarding Back: no CVA tenderness Extremities: no edema; symmetrical with no gross deformities Skin: warm and dry Neurologic: normal gait Psychological: alert and cooperative; normal mood and affect  LABS: No results found for this or any previous visit (from the past 24 hour(s)).  DIAGNOSTIC STUDIES: No results found.   ASSESSMENT & PLAN:  1. Right upper quadrant abdominal pain     Discussed with patient that this could be pancreatitis, possible ulcer and that I think he needs further workup with GI Ambulatory referral placed for GI Declines blood work today Avoid milk, greasy foods and anything that doesn't agree with you.  Strict ER precautions given If you experience new or worsening symptoms return or go to ER such as fever, chills, nausea, vomiting, diarrhea, bloody or dark tarry stools, constipation, urinary symptoms, worsening abdominal discomfort, symptoms that do not improve with medications, inability to keep fluids down.  Reviewed expectations re: course of current medical issues. Questions answered. Outlined signs and symptoms indicating need for more acute intervention. Patient verbalized understanding. After Visit Summary given.    Moshe Cipro, NP 03/10/20 [phone]

## 2020-03-11 ENCOUNTER — Telehealth: Payer: Self-pay | Admitting: General Practice

## 2020-03-11 NOTE — Telephone Encounter (Signed)
calle pt lvm for him to call back reg  Upcoming TOC care appt . Needs to be rescheduled earlier in the day or another day  provider overbooked

## 2020-03-31 DIAGNOSIS — R1011 Right upper quadrant pain: Secondary | ICD-10-CM

## 2020-03-31 HISTORY — DX: Right upper quadrant pain: R10.11

## 2020-04-09 ENCOUNTER — Telehealth: Payer: Self-pay

## 2020-04-09 ENCOUNTER — Encounter: Payer: Self-pay | Admitting: Nurse Practitioner

## 2020-04-09 ENCOUNTER — Other Ambulatory Visit (INDEPENDENT_AMBULATORY_CARE_PROVIDER_SITE_OTHER): Payer: Self-pay

## 2020-04-09 ENCOUNTER — Ambulatory Visit: Payer: Self-pay | Admitting: Nurse Practitioner

## 2020-04-09 DIAGNOSIS — K219 Gastro-esophageal reflux disease without esophagitis: Secondary | ICD-10-CM

## 2020-04-09 DIAGNOSIS — R1011 Right upper quadrant pain: Secondary | ICD-10-CM

## 2020-04-09 LAB — CBC WITH DIFFERENTIAL/PLATELET
Basophils Absolute: 0.1 10*3/uL (ref 0.0–0.1)
Basophils Relative: 1.2 % (ref 0.0–3.0)
Eosinophils Absolute: 0.2 10*3/uL (ref 0.0–0.7)
Eosinophils Relative: 2 % (ref 0.0–5.0)
HCT: 44.8 % (ref 39.0–52.0)
Hemoglobin: 15.5 g/dL (ref 13.0–17.0)
Lymphocytes Relative: 38 % (ref 12.0–46.0)
Lymphs Abs: 3.4 10*3/uL (ref 0.7–4.0)
MCHC: 34.5 g/dL (ref 30.0–36.0)
MCV: 93 fl (ref 78.0–100.0)
Monocytes Absolute: 0.4 10*3/uL (ref 0.1–1.0)
Monocytes Relative: 4.7 % (ref 3.0–12.0)
Neutro Abs: 4.8 10*3/uL (ref 1.4–7.7)
Neutrophils Relative %: 54.1 % (ref 43.0–77.0)
Platelets: 213 10*3/uL (ref 150.0–400.0)
RBC: 4.81 Mil/uL (ref 4.22–5.81)
RDW: 13.1 % (ref 11.5–15.5)
WBC: 8.9 10*3/uL (ref 4.0–10.5)

## 2020-04-09 LAB — COMPREHENSIVE METABOLIC PANEL
ALT: 30 U/L (ref 0–53)
AST: 26 U/L (ref 0–37)
Albumin: 4.6 g/dL (ref 3.5–5.2)
Alkaline Phosphatase: 79 U/L (ref 39–117)
BUN: 14 mg/dL (ref 6–23)
CO2: 26 mEq/L (ref 19–32)
Calcium: 9.3 mg/dL (ref 8.4–10.5)
Chloride: 108 mEq/L (ref 96–112)
Creatinine, Ser: 0.94 mg/dL (ref 0.40–1.50)
GFR: 89.84 mL/min (ref >60.00)
Glucose, Bld: 109 mg/dL — ABNORMAL HIGH (ref 70–99)
Potassium: 3.9 mEq/L (ref 3.5–5.1)
Sodium: 142 mEq/L (ref 135–145)
Total Bilirubin: 0.8 mg/dL (ref 0.2–1.2)
Total Protein: 7.5 g/dL (ref 6.0–8.3)

## 2020-04-09 MED ORDER — OMEPRAZOLE 20 MG PO CPDR: 20.0000 mg | DELAYED_RELEASE_CAPSULE | Freq: Two times a day (BID) | ORAL | Status: AC

## 2020-04-09 NOTE — Telephone Encounter (Signed)
Called patient to inform him we had a cancellation and offered an earlier appointment time.  He agrees to arrive at 1:30 for a 2:30 procedure.

## 2020-04-09 NOTE — Patient Instructions (Signed)
If you are age 38 or older, your body mass index should be between 23-30. Your Body mass index is 34.24 kg/m. If this is out of the aforementioned range listed, please consider follow up with your Primary Care Provider.  If you are age 14 or younger, your body mass index should be between 19-25. Your Body mass index is 34.24 kg/m. If this is out of the aformentioned range listed, please consider follow up with your Primary Care Provider.   Your provider has requested that you go to the basement level for lab work before leaving today. Press "B" on the elevator. The lab is located at the first door on the left as you exit the elevator.  You have been scheduled for an endoscopy. Please follow written instructions given to you at your visit today. If you use inhalers (even only as needed), please bring them with you on the day of your procedure.  Due to recent changes in healthcare laws, you may see the results of your imaging and laboratory studies on MyChart before your provider has had a chance to review them.  We understand that in some cases there may be results that are confusing or concerning to you. Not all laboratory results come back in the same time frame and the provider may be waiting for multiple results in order to interpret others.  Please give [place] 48 hours in order for your provider to thoroughly review all the results before contacting the office for clarification of your results.   Please INCREASE omeprazole to 20mg  take 1 capsule twice daily.  Please call office if symptoms worsen or fail to improve.   Thank you for entrusting me with your care and choosing [facility].  [facility], NP

## 2020-04-09 NOTE — Progress Notes (Signed)
04/09/2020 Scott Best [id number] 1981-04-08   CHIEF COMPLAINT: Right upper quadrant abdominal pain  HISTORY OF PRESENT ILLNESS: Scott Best is a 39 year old male with a past medical history of anxiety, depression, hypertension, sleep apnea does not use cpap, chronic headaches and vitamin D deficiency.  Status post cholecystectomy secondary to gallstones in 2010 or 2011.  He presents today for further evaluation regarding right upper quadrant abdominal pain. About one  month ago, he ate enchiladas and the next day he developed diarrhea.  The second day he developed right upper quadrant pain and nonbloody diarrhea. He presented to [facility] urgent [facility] center on 03/02/2020 for further evaluation.  He was prescribed of brat diet and to take Dicyclomine and Imodium as needed.  His diarrhea abated but he continued to have right upper quadrant abdominal pain.  Eating did not worsen or lessen his abdominal pain.  No nausea or vomiting.  He presented back to the urgent care center on 03/08/2020 and GI referral was ordered.  He reports having a history of GERD for which she takes omeprazole 20 mg once daily for the past 9 years.  He denies having any heartburn as long as he takes the omeprazole.  No dysphagia.  His severe right upper quadrant abdominal pain has abated, however, he continues to have a mild right upper quadrant discomfort/soreness.  No lower abdominal pain.  No nausea or vomiting.  He is passing a normal brown formed bowel movement daily.  He took Pepto-Bismol 1 month ago when he had diarrhea which resulted in passing a black solid stool the next day.  No further black stools.  No rectal bleeding.  He has a history of chronic headaches but he denies NSAID use.  He is attempting to eat a healthier diet.  He is lost approximately 5 pounds over the past month due to diet changes.  His paternal grandmother has a history of stomach cancer.   CMP Latest Ref Rng & Units 02/18/2020  11/18/2019 06/07/2019  Glucose 65 - 99 mg/dL - - 88  BUN 6 - 20 mg/dL - - 11  Creatinine 0.86 - 1.27 mg/dL - - 5.78  Sodium 469 - 144 mmol/L - - 143  Potassium 3.5 - 5.2 mmol/L - - 3.7  Chloride 96 - 106 mmol/L - - 110(H)  CO2 20 - 29 mmol/L - - 20  Calcium 8.7 - 10.2 mg/dL - - 9.0  Total Protein 6.0 - 8.5 g/dL 7.0 7.3 6.9  Total Bilirubin 0.0 - 1.2 mg/dL 0.5 0.2 0.4  Alkaline Phos 48 - 121 IU/L 82 92 88  AST 0 - 40 IU/L 25 33 55(H)  ALT 0 - 44 IU/L 28 49(H) 57(H)     Past Medical History:  Diagnosis Date  . Allergy   . Anxiety   . Elevated BP without diagnosis of hypertension 06/07/2019  . Numbness and tingling of left upper and lower extremity 06/07/2019  . RUQ abdominal pain 03/31/2020  . Vitamin D deficiency 06/07/2019   Past Surgical History:  Procedure Laterality Date  . CHOLECYSTECTOMY    . EYE SURGERY     R eye repair nail injury  . HERNIA REPAIR      Social history: He smoked cigarettes for 1 year.  Past smoker, quit smoking 15 years.  He drinks 3 or 4 one day weekly. No drug use.   Family history: Father died 83 due to heart attack, diabetes and gout. Mother age 39 with diabetes. MGF ?  Cancer. PGM stomach cancer.   No Known Allergies    Outpatient Encounter Medications as of 04/09/2020  Medication Sig  . dicyclomine (BENTYL) 20 MG tablet Take 1 tablet (20 mg total) by mouth 4 (four) times daily -  before meals and at bedtime.  Marland Kitchen omeprazole (PRILOSEC) 20 MG capsule Take 20 mg by mouth daily.  Marland Kitchen terbinafine (LAMISIL) 1 % cream Apply 1 application topically 2 (two) times daily. Apply to clean dry feet. (Patient not taking: Reported on 11/18/2019)  . terbinafine (LAMISIL) 250 MG tablet Take 1 tablet (250 mg total) by mouth daily.   No facility-administered encounter medications on file as of 04/09/2020.     REVIEW OF SYSTEMS: Gen: Denies fever, sweats or chills. + 5 lb intentional weight loss.  CV: Denies chest pain, palpitations or edema. Resp: Denies cough,  shortness of breath of hemoptysis.  GI: See HPI. GU : Denies urinary burning, blood in urine, increased urinary frequency or incontinence. MS: Denies joint pain, muscles aches or weakness. Derm: Denies rash, itchiness, skin lesions or unhealing ulcers. Psych:  + anxiety. Heme: Denies bruising, bleeding. Neuro:  Denies headaches, dizziness or paresthesias. Endo:  Denies any problems with DM, thyroid or adrenal function.    PHYSICAL EXAM:  BP 130/80   Pulse 68   Ht 5\' 7"  (1.702 m)   Wt 218 lb 9.6 oz (99.2 kg)   BMI 34.24 kg/m  General: Well developed 39 year old male in no acute distress. Head: Normocephalic and atraumatic. Eyes:  Sclerae non-icteric, conjunctive pink. Ears: Normal auditory acuity. Mouth: Dentition intact. No ulcers or lesions.  Neck: Supple, no lymphadenopathy or thyromegaly.  Lungs: Clear bilaterally to auscultation without wheezes, crackles or rhonchi. Heart: Regular rate and rhythm. No murmur, rub or gallop appreciated.  Abdomen: Soft, nontender, non distended. No masses. No hepatosplenomegaly. Normoactive bowel sounds x 4 quadrants.  Rectal: Deferred. Musculoskeletal: Symmetrical with no gross deformities. Skin: Warm and dry. No rash or lesions on visible extremities. Extremities: No edema. Neurological: Alert oriented x 4, no focal deficits.  Psychological:  Alert and cooperative. Normal mood and affect.  ASSESSMENT AND PLAN:  72.  39 year old male S/P cholecystectomy due to gallstone in 2010 or 2011 presents with RUQ pain. Family history of stomach cancer.  -GERD handout -Increase omeprazole 20 mg p.o. twice daily -EGD benefits and risks discussed including risk with sedation, risk of bleeding, perforation and infection -Patient to call our office if black stools recur or if his abdominal pain worsens -No NSAIDs -Further follow-up to be determined after the above evaluation completed  2.  History of GERD symptoms. -See plan in #1    CC:   2012, NP

## 2020-04-10 ENCOUNTER — Ambulatory Visit (AMBULATORY_SURGERY_CENTER): Payer: Self-pay | Admitting: Gastroenterology

## 2020-04-10 ENCOUNTER — Other Ambulatory Visit: Payer: Self-pay

## 2020-04-10 ENCOUNTER — Encounter: Payer: Self-pay | Admitting: Gastroenterology

## 2020-04-10 VITALS — BP 104/59 | HR 68 | Temp 98.6°F | Resp 13 | Ht 67.0 in | Wt 218.0 lb

## 2020-04-10 DIAGNOSIS — R1011 Right upper quadrant pain: Secondary | ICD-10-CM

## 2020-04-10 DIAGNOSIS — K219 Gastro-esophageal reflux disease without esophagitis: Secondary | ICD-10-CM

## 2020-04-10 MED ORDER — SODIUM CHLORIDE 0.9 % IV SOLN: 500.0000 mL | Freq: Once | INTRAVENOUS | Status: DC

## 2020-04-10 NOTE — Op Note (Signed)
[facility] Patient Name: Scott Best Procedure Date: 04/10/2020 2:05 PM MRN: [id number] Endoscopist: Napoleon Form , MD Age: 39 Referring MD:  Date of Birth: 1980/12/15 Gender: Male Account #: [id number] Procedure:                Upper GI endoscopy Indications:              Abdominal pain in the right upper quadrant,                            Esophageal reflux symptoms that persist despite                            appropriate therapy Medicines:                Monitored Anesthesia Care Procedure:                Pre-Anesthesia Assessment:                           - Prior to the procedure, a History and Physical                            was performed, and patient medications and                            allergies were reviewed. The patient's tolerance of                            previous anesthesia was also reviewed. The risks                            and benefits of the procedure and the sedation                            options and risks were discussed with the patient.                            All questions were answered, and informed consent                            was obtained. Prior Anticoagulants: The patient has                            taken no previous anticoagulant or antiplatelet                            agents. ASA Grade Assessment: II - A patient with                            mild systemic disease. After reviewing the risks                            and benefits, the patient was deemed in  satisfactory condition to undergo the procedure.                           After obtaining informed consent, the endoscope was                            passed under direct vision. Throughout the                            procedure, the patient's blood pressure, pulse, and                            oxygen saturations were monitored continuously. The                            Endoscope was introduced through  the mouth, and                            advanced to the second part of duodenum. The upper                            GI endoscopy was accomplished without difficulty.                            The patient tolerated the procedure well. Scope In: Scope Out: Findings:                 The Z-line was irregular <0.5cm and was found 37 cm                            from the incisors.                           The gastroesophageal flap valve was visualized                            endoscopically and classified as Hill Grade IV (no                            fold, wide open lumen, hiatal hernia present).                           The stomach was normal.                           The cardia and gastric fundus were normal on                            retroflexion.                           A mild extrinsic deformity was found in the first                            portion of the duodenum along the duodenal sweep.  No visible ulceration or mucosal lesion. Complications:            No immediate complications. Estimated Blood Loss:     Estimated blood loss was minimal. Impression:               - Z-line irregular, 37 cm from the incisors.                           - Gastroesophageal flap valve classified as Hill                            Grade IV (no fold, wide open lumen, hiatal hernia                            present).                           - Normal stomach.                           - Duodenal deformity.                           - No specimens collected. Recommendation:           - Patient has a contact number available for                            emergencies. The signs and symptoms of potential                            delayed complications were discussed with the                            patient. Return to normal activities tomorrow.                            Written discharge instructions were provided to the                             patient.                           - Resume previous diet.                           - Continue present medications.                           - Continue Omperazole                           - Perform an upper GI series through at appointment                            to be scheduled.                           -  Perform a RUQ ultrasound at appointment to be                            scheduled.                           - Follow up in office visit next available                            appointment in 3 months, please call [phone]                            to schedule office visit Napoleon Form, MD 04/10/2020 2:45:10 PM This report has been signed electronically.

## 2020-04-10 NOTE — Patient Instructions (Signed)
Resume revious diet  continue present medication  Continue omeprazole Upper GI to be scheduled Make follow up visit in 3 months  YOU HAD AN ENDOSCOPIC PROCEDURE TODAY AT THE [facility]:   Refer to the procedure report that was given to you for any specific questions about what was found during the examination.  If the procedure report does not answer your questions, please call your gastroenterologist to clarify.  If you requested that your care partner not be given the details of your procedure findings, then the procedure report has been included in a sealed envelope for you to review at your convenience later.  YOU SHOULD EXPECT: Some feelings of bloating in the abdomen. Passage of more gas than usual.  Walking can help get rid of the air that was put into your GI tract during the procedure and reduce the bloating. If you had a lower endoscopy (such as a colonoscopy or flexible sigmoidoscopy) you may notice spotting of blood in your stool or on the toilet paper. If you underwent a bowel prep for your procedure, you may not have a normal bowel movement for a few days.  Please Note:  You might notice some irritation and congestion in your nose or some drainage.  This is from the oxygen used during your procedure.  There is no need for concern and it should clear up in a day or so.  SYMPTOMS TO REPORT IMMEDIATELY:    Following upper endoscopy (EGD)  Vomiting of blood or coffee ground material  New chest pain or pain under the shoulder blades  Painful or persistently difficult swallowing  New shortness of breath  Fever of 100F or higher  Black, tarry-looking stools  For urgent or emergent issues, a gastroenterologist can be reached at any hour by calling [phone]. Do not use MyChart messaging for urgent concerns.    DIET:  We do recommend a small meal at first, but then you may proceed to your regular diet.  Drink plenty of fluids but you should avoid alcoholic  beverages for 24 hours.  ACTIVITY:  You should plan to take it easy for the rest of today and you should NOT DRIVE or use heavy machinery until tomorrow (because of the sedation medicines used during the test).    FOLLOW UP: Our staff will call the number listed on your records 48-72 hours following your procedure to check on you and address any questions or concerns that you may have regarding the information given to you following your procedure. If we do not reach you, we will leave a message.  We will attempt to reach you two times.  During this call, we will ask if you have developed any symptoms of COVID 19. If you develop any symptoms (ie: fever, flu-like symptoms, shortness of breath, cough etc.) before then, please call [phone].  If you test positive for Covid 19 in the 2 weeks post procedure, please call and report this information to [place].    If any biopsies were taken you will be contacted by phone or by letter within the next 1-3 weeks.  Please call us at [phone] if you have not heard about the biopsies in 3 weeks.    SIGNATURES/CONFIDENTIALITY: You and/or your care partner have signed paperwork which will be entered into your electronic medical record.  These signatures attest to the fact that that the information above on your After Visit Summary has been reviewed and is understood.  Full responsibility of the confidentiality  of this discharge information lies with you and/or your care-partner.

## 2020-04-10 NOTE — Progress Notes (Signed)
Report to PACU, RN, vss, BBS= Clear.  

## 2020-04-13 NOTE — Progress Notes (Signed)
Reviewed and agree with documentation and assessment and plan. K. Veena Marquasia Schmieder , MD   

## 2020-04-14 ENCOUNTER — Telehealth: Payer: Self-pay

## 2020-04-14 NOTE — Telephone Encounter (Signed)
  Follow up Call-  Call back number 04/10/2020  Post procedure Call Back phone  # [phone]  Permission to leave phone message Yes  Some recent data might be hidden     Patient questions:  Do you have a fever, pain , or abdominal swelling? No. Pain Score  0 *  Have you tolerated food without any problems? Yes.    Have you been able to return to your normal activities? Yes.    Do you have any questions about your discharge instructions: Diet   No. Medications  No. Follow up visit  No.  Do you have questions or concerns about your Care? No.  Actions: * If pain score is 4 or above: No action needed, pain <4.   1. Have you developed a fever since your procedure? No   2.   Have you had an respiratory symptoms (SOB or cough) since your procedure? No   3.   Have you tested positive for COVID 19 since your procedure? No   4.   Have you had any family members/close contacts diagnosed with the COVID 19 since your procedure?  No    If yes to any of these questions please route to Laverna Peace, RN and Karlton Lemon, RN

## 2020-04-21 ENCOUNTER — Other Ambulatory Visit: Payer: Self-pay

## 2020-04-21 ENCOUNTER — Telehealth: Payer: Self-pay

## 2020-04-21 DIAGNOSIS — K219 Gastro-esophageal reflux disease without esophagitis: Secondary | ICD-10-CM

## 2020-04-21 DIAGNOSIS — R1011 Right upper quadrant pain: Secondary | ICD-10-CM

## 2020-04-21 NOTE — Telephone Encounter (Signed)
Spoke with patient regarding his upcoming appointments. Pt is scheduled for an [place] on 04/30/20 at 8 am with a 7:45 am arrival time at [facility]. Patient aware nothing to eat or drink after midnight, pt aware that Upper GI series is scheduled at 9 am following his [place]. Advised patient to call office with any questions.

## 2020-04-30 ENCOUNTER — Ambulatory Visit (HOSPITAL_COMMUNITY)
Admission: RE | Admit: 2020-04-30 | Discharge: 2020-04-30 | Disposition: A | Discharge status: Home / Self Care | Payer: Self-pay | Source: Ambulatory Visit | Attending: Gastroenterology | Admitting: Gastroenterology

## 2020-04-30 ENCOUNTER — Other Ambulatory Visit: Payer: Self-pay

## 2020-04-30 DIAGNOSIS — R1011 Right upper quadrant pain: Secondary | ICD-10-CM | POA: Insufficient documentation

## 2020-04-30 DIAGNOSIS — K219 Gastro-esophageal reflux disease without esophagitis: Secondary | ICD-10-CM | POA: Insufficient documentation

## 2020-05-01 ENCOUNTER — Telehealth: Payer: Self-pay | Admitting: Gastroenterology

## 2020-05-01 NOTE — Telephone Encounter (Signed)
Pt's spouse is requesting a call back regarding US results. 

## 2020-05-01 NOTE — Telephone Encounter (Signed)
Napoleon Form, MD  McKew, Austin Miles, LPN Findings suggestive of fatty liver otherwise unremarkable.  Please advise patient to avoid high fat/high carb diet or excessive amount of processed foods/soda  Please inform patient the results. Thanks   The patient has been notified of this information and all questions answered.

## 2020-05-21 ENCOUNTER — Encounter: Payer: Self-pay | Admitting: Family Medicine

## 2020-05-21 ENCOUNTER — Other Ambulatory Visit: Payer: Self-pay

## 2020-05-21 ENCOUNTER — Ambulatory Visit (INDEPENDENT_AMBULATORY_CARE_PROVIDER_SITE_OTHER): Payer: Self-pay | Admitting: Family Medicine

## 2020-05-21 VITALS — BP 122/79 | HR 70 | Temp 98.7°F | Ht 67.0 in | Wt 219.4 lb

## 2020-05-21 DIAGNOSIS — Z23 Encounter for immunization: Secondary | ICD-10-CM

## 2020-05-21 DIAGNOSIS — K760 Fatty (change of) liver, not elsewhere classified: Secondary | ICD-10-CM

## 2020-05-21 DIAGNOSIS — N281 Cyst of kidney, acquired: Secondary | ICD-10-CM

## 2020-05-21 DIAGNOSIS — Z0000 Encounter for general adult medical examination without abnormal findings: Secondary | ICD-10-CM

## 2020-05-21 DIAGNOSIS — F411 Generalized anxiety disorder: Secondary | ICD-10-CM

## 2020-05-21 DIAGNOSIS — Z0001 Encounter for general adult medical examination with abnormal findings: Secondary | ICD-10-CM

## 2020-05-21 NOTE — Progress Notes (Signed)
9/23/20211:36 PM  Scott Best 1980-09-24, 39 y.o., male [id number]  Chief Complaint  Patient presents with  . Annual Exam    wants to follow up with the US done on liver, having occasional pin on right side of ab    HPI:   Patient is a 39 y.o. male with past medical history significant for GERD with HH, fatty liver, right renal cyst who presents today for CPE  Colorectal Cancer Screening: at age 81, no fhx Prostate Cancer Screening: at age 89, no fhx HIV Screening: declines STI Screening: declines Most Recent Immunizations  Administered Date(s) Administered  . Influenza Inj Mdck Quad Pf 05/20/2020  . Influenza,inj,Quad PF,6+ Mos 07/22/2019  . Tdap 06/18/2016  he reports he did not have flu vaccine yesterday and would like today Pneumococcal Vaccination: at age 86 Zoster Vaccination: at age 24 covid vaccination: reports completed Frequency of Dental evaluation: needs to make appt Frequency of Eye evaluation: wears eyeglasses, sees yearly Divorced, no children, works Holiday representative Having increase in anxiety for past several weeks, very stressed at work, GAD 7 = 7 GI - Dr Lavon Paganini Incidental finding of renal cyst 2.6 cm, right  Lab Results  Component Value Date   WBC 8.9 04/09/2020   HGB 15.5 04/09/2020   HCT 44.8 04/09/2020   MCV 93.0 04/09/2020   PLT 213.0 04/09/2020   Lab Results  Component Value Date   CREATININE 0.94 04/09/2020   BUN 14 04/09/2020   NA 142 04/09/2020   K 3.9 04/09/2020   CL 108 04/09/2020   CO2 26 04/09/2020   Lab Results  Component Value Date   ALT 30 04/09/2020   AST 26 04/09/2020   ALKPHOS 79 04/09/2020   BILITOT 0.8 04/09/2020   Lab Results  Component Value Date   CHOL 218 (H) 02/18/2020   HDL 39 (L) 02/18/2020   LDLCALC 123 (H) 02/18/2020   TRIG 319 (H) 02/18/2020   CHOLHDL 5.6 (H) 02/18/2020    Depression screen PHQ 2/9 11/18/2019 07/22/2019 06/24/2019  Decreased Interest 0 0 0  Down, Depressed, Hopeless 0 0 0    PHQ - 2 Score 0 0 0  Altered sleeping - - -  Tired, decreased energy - - -  Change in appetite - - -  Feeling bad or failure about yourself  - - -  Trouble concentrating - - -  Moving slowly or fidgety/restless - - -  Suicidal thoughts - - -  PHQ-9 Score - - -  Difficult doing work/chores - - -    Fall Risk  05/21/2020 11/18/2019 07/22/2019 06/24/2019 06/07/2019  Falls in the past year? 0 0 0 0 0  Number falls in past yr: 0 0 0 0 0  Injury with Fall? 0 0 0 0 0  Follow up - Falls evaluation completed Falls evaluation completed Falls evaluation completed Falls evaluation completed     No Known Allergies  Prior to Admission medications   Medication Sig Start Date End Date Taking? Authorizing Provider  omeprazole (PRILOSEC) 20 MG capsule Take 1 capsule (20 mg total) by mouth in the morning and at bedtime. 04/09/20  Yes Arnaldo Natal, NP    Past Medical History:  Diagnosis Date  . Allergy   . Anxiety   . Chronic headaches   . Depression   . Elevated BP without diagnosis of hypertension 06/07/2019  . Gallstones   . GERD (gastroesophageal reflux disease)   . Hyperlipidemia   . Numbness and tingling of left upper and  lower extremity 06/07/2019  . RUQ abdominal pain 03/31/2020  . Sleep apnea   . Vitamin D deficiency 06/07/2019    Past Surgical History:  Procedure Laterality Date  . CHOLECYSTECTOMY    . EYE SURGERY     R eye repair nail injury  . HERNIA REPAIR      Social History   Tobacco Use  . Smoking status: Former Games developer  . Smokeless tobacco: Never Used  Substance Use Topics  . Alcohol use: Yes    Alcohol/week: 7.0 standard drinks    Types: 7 Cans of beer per week    Family History  Problem Relation Age of Onset  . Diabetes Mother   . Heart disease Father   . Cancer Maternal Grandfather        "started on toe"  . Cancer Paternal Grandmother        unknown type- thinks it was in abdomen  . Stomach cancer Paternal Grandmother   . Colon cancer Neg Hx    . Esophageal cancer Neg Hx   . Rectal cancer Neg Hx     Review of Systems  Constitutional: Negative for chills and fever.  HENT: Negative for ear pain, hearing loss and sore throat.   Eyes: Negative for blurred vision and double vision.  Respiratory: Negative for cough and shortness of breath.   Cardiovascular: Negative for chest pain, palpitations and leg swelling.  Gastrointestinal: Negative for abdominal pain, constipation, diarrhea, nausea and vomiting.  Genitourinary: Negative for dysuria and hematuria.  Musculoskeletal: Negative for joint pain and myalgias.  Neurological: Negative for dizziness, tingling, focal weakness and headaches.  Endo/Heme/Allergies: Negative for polydipsia.  Psychiatric/Behavioral: Negative for depression and substance abuse. The patient is nervous/anxious and has insomnia.   All other systems reviewed and are negative.    OBJECTIVE:  Today's Vitals   05/21/20 1333  BP: 122/79  Pulse: 70  Temp: 98.7 F (37.1 C)  SpO2: 97%  Weight: 219 lb 6.4 oz (99.5 kg)  Height: 5\' 7"  (1.702 m)   Body mass index is 34.36 kg/m.  Wt Readings from Last 3 Encounters:  05/21/20 219 lb 6.4 oz (99.5 kg)  04/10/20 218 lb (98.9 kg)  04/09/20 218 lb 9.6 oz (99.2 kg)    Physical Exam Vitals and nursing note reviewed.  Constitutional:      Appearance: He is well-developed.  HENT:     Head: Normocephalic and atraumatic.     Right Ear: Hearing, tympanic membrane, ear canal and external ear normal.     Left Ear: Hearing, tympanic membrane, ear canal and external ear normal.     Mouth/Throat:     Pharynx: No oropharyngeal exudate.  Eyes:     Extraocular Movements: Extraocular movements intact.     Conjunctiva/sclera: Conjunctivae normal.     Pupils: Pupils are equal, round, and reactive to light.  Neck:     Thyroid: No thyromegaly.  Cardiovascular:     Rate and Rhythm: Normal rate and regular rhythm.     Heart sounds: Normal heart sounds. No murmur heard.   No friction rub. No gallop.   Pulmonary:     Effort: Pulmonary effort is normal.     Breath sounds: Normal breath sounds. No wheezing, rhonchi or rales.  Abdominal:     General: Bowel sounds are normal. There is no distension.     Palpations: Abdomen is soft. There is no mass.     Tenderness: There is no abdominal tenderness.  Musculoskeletal:  General: Normal range of motion.     Cervical back: Neck supple.     Right lower leg: No edema.     Left lower leg: No edema.  Lymphadenopathy:     Cervical: No cervical adenopathy.  Skin:    General: Skin is warm and dry.  Neurological:     Mental Status: He is alert and oriented to person, place, and time.     Cranial Nerves: No cranial nerve deficit.     Coordination: Coordination normal.     Gait: Gait normal.     Deep Tendon Reflexes: Reflexes are normal and symmetric.  Psychiatric:        Mood and Affect: Mood normal.        Behavior: Behavior normal.     No results found for this or any previous visit (from the past 24 hour(s)).  No results found.   ASSESSMENT and PLAN  1. Annual physical exam HCM reviewed/discussed. Anticipatory guidance regarding healthy weight, lifestyle and choices given.   2. Need for influenza vaccination  3. Fatty liver Discussed weight loss, avoidance of liver toxins  4. Renal cyst, right Incidental finding. Monitor with [place] in 1 year.   5. GAD (generalized anxiety disorder) Mild, 2/2 increased life stressors, discussed LFM and RTC precautions  Other orders - Flu Vaccine QUAD 36+ mos IM  Return in about 1 year (around 05/21/2021).    Myles Lipps, MD Primary Care at [facility] [address] [place], [place] [address] Ph.  [phone] Fax [phone]

## 2020-05-21 NOTE — Patient Instructions (Addendum)
Cp      If you have lab work done today you will be contacted with your lab results within the next 2 weeks.  If you have not heard from us then please contact us. The fastest way to get your results is to register for My Chart.   IF you received an x-ray today, you will receive an invoice from [facility]. Please contact [facility] at [phone] with questions or concerns regarding your invoice.   IF you received labwork today, you will receive an invoice from [facility]. Please contact [facility] at [phone] with questions or concerns regarding your invoice.   Our billing staff will not be able to assist you with questions regarding bills from these companies.  You will be contacted with the lab results as soon as they are available. The fastest way to get your results is to activate your My Chart account. Instructions are located on the last page of this paperwork. If you have not heard from us regarding the results in 2 weeks, please contact this office.     Control de la Newmont Miningansiedad en los adultos Managing Anxiety, Adult Despus de haber sido diagnosticado con trastorno de ansiedad, podra sentirse aliviado por comprender por qu se haba sentido o haba actuado de cierto modo. Es posible que tambin se sienta abrumado por el tratamiento que tiene por delante y por lo que este significar para su vida. Con atencin y [place]ayuda, [place] manejar esta afeccin y recuperarse. Cmo manejar los cambios en el estilo de vida Control del estrs y la ansiedad  El estrs es la reaccin del cuerpo ante los cambios y los acontecimientos de la vida, tanto buenos [place] Pointcomo malos. La Harley-Davidsonmayora de los episodios de estrs duran slo algunas horas, pero el estrs puede ser continuo y Science writerconducir a ms que solo estrs. Aunque el estrs puede desempear un papel importante en la ansiedad, no es lo mismo que la ansiedad. El estrs generalmente es causado por algo externo, como una fecha lmite, una prueba  o una competencia. El estrs normalmente pasa despus de que el evento desencadenante ha terminado.  La ansiedad es causada por algo interno, por ejemplo, imaginar un resultado terrible o preocuparse porque algo ir mal y lo devastar. A menudo, la ansiedad no desaparece incluso despus de que el evento desencadenante ha finalizado y puede tornarse en una preocupacin a largo plazo (crnica). Es importante comprender las diferencias entre el estrs y la ansiedad, y Chief Operating Officercontrolar el estrs de manera efectiva para que no genere una respuesta de ansiedad. Hable con el mdico o un consejero para obtener ms informacin sobre cmo reducir la ansiedad y Development worker, communityel estrs. Es posible que el profesional sugiera tcnicas para reducir la tensin, tales como:  Musicoterapia. Esto podra incluir crear o escuchar msica que disfrute y lo inspire.  Meditacin consciente. Esto implica prestar atencin a la respiracin normal sin intentar controlarla. Puede realizarse mientras est sentado o camina.  Oracin centrante. Esto implica centrarse en una palabra, frase o imagen sagrada que le signifique algo y le genere paz.  Respiracin profunda. Para hacer esto, expanda el estmago e inhale lentamente por la nariz. Mantenga el aire durante unos 3a5segundos. Luego, exhale lentamente mientras deja que los msculos del estmago se relajen.  Dilogo interno. Esto implica identificar patrones de pensamiento que provocan reacciones de ansiedad y cambiar esos patrones.  Relajacin muscular. Esto implica tensar los msculos y, Burkeluego, Pingreerelajarlos. Elija una tcnica para reducir la tensin que se adapte a su estilo de vida  y su personalidad. Estas tcnicas llevan tiempo y prctica. Resrvese de 5a16minutos por da para Associate Professor. Algunos terapeutas pueden ofrecer orientacin y capacitacin en estas tcnicas. Es posible que algunos planes de seguros mdicos cubran la capacitacin. Otras cosas que puede hacer para controlar el estrs y la  ansiedad incluyen:  Llevar un diario de estrs/ansiedad. Esto puede ayudarlo a identificar qu le desencadena su reaccin y, Engineer, mining, aprender las maneras de controlar su respuesta al [place].  Pensar en cmo reacciona ante ciertas situaciones. Es posible que no sea capaz de Chief Operating Officer todo, pero puede Corporate investment banker.  Hacerse tiempo para las actividades que lo ayudan a Lexicographer y no sentir culpa por pasar su tiempo de [place].  La formacin de imgenes visuales y el yoga pueden ayudarlo a Pharmacologist la calma y [place].  Medicamentos Los medicamentos pueden ayudar a [employer]. Algunos medicamentos para la ansiedad:  Programme researcher, broadcasting/film/video ansiedad.  Antidepresivos. A menudo, los medicamentos se usan como tratamiento primario para el trastorno de [employer]ah. Un mdico recetar los medicamentos. Cuando se usan juntos, los medicamentos, la psicoterapia y las tcnicas de reduccin de la tensin pueden ser el tratamiento ms efectivo. Las Hess Corporation relaciones interpersonales pueden ser muy importantes para ayudar a su recuperacin. Intente pasar ms tiempo interactuando con amigos y familiares de [place]. Considere la posibilidad de ir a terapia de pareja, tomar clases de educacin familiar o ir a Information systems manager. La terapia puede ayudarlos a usted y a los dems a comprender mejor su afeccin. Cmo Facilities manager en su ansiedad Cada persona responde de [place] diferente al tratamiento de la ansiedad. Se dice que est recuperado de la ansiedad cuando los sntomas disminuyen y dejan de Producer, television/film/video en las actividades diarias en el hogar o [place]. Esto podra significar que usted comenzar a Radio producer lo siguiente:  Dealer y atencin. Tener menos interferencia de la preocupacin en el pensamiento diario.  Dormir mejor.  Estar menos irritable.  Tener ms energa.  Tener [employer]. Es Public librarian cundo el trastorno [place]. Comunquese  con el mdico si sus sntomas interfieren en su hogar o su trabajo, y usted siente que su afeccin no est mejorando. Siga estas instrucciones en su casa: Actividad  Realizar actividad fsica. La Harley-Davidson de los adultos debe hacer lo siguiente: ? Education officer, environmental, al [place], de actividad fsica por semana. El ejercicio debe aumentar la frecuencia cardaca y Media planner transpirar (ejercicio de intensidad moderada). ? Realizar ejercicios de fortalecimiento por lo [employer] por semana.  Dormir bien y por el tiempo adecuado. La Harley-Davidson de los adultos necesitan entre 7y9horas de sueo todas las noches. Estilo de vida   Siga una dieta saludable que incluya abundantes frutas, verduras, cereales integrales, productos lcteos descremados y protenas magras. No consuma muchos alimentos ricos en grasas slidas, azcares agregados o sal.  Opte por cosas que le simplifiquen la vida.  No consuma ningn producto que contenga nicotina o tabaco, como cigarrillos, cigarrillos electrnicos y tabaco de Theatre manager. Si necesita ayuda para dejar de fumar, consulte al mdico.  Evite el consumo de cafena, alcohol y ciertos medicamentos contra el resfro de venta sin receta. Estos podran Optician, dispensing. Pregntele al farmacutico qu medicamentos no debera tomar. Instrucciones generales  [employer] de venta libre y los recetados solamente como se lo haya indicado el mdico.  Oceanographer a todas las visitas de seguimiento como se lo haya indicado el mdico. Esto es importante. Dnde buscar apoyo Puede conseguir [place] y apoyo  en los siguientes lugares:  Grupos de [place].  Organizaciones comunitarias y en lnea.  Un lder espiritual de confianza.  Terapia de pareja.  Clases de educacin familiar.  Terapia familiar. Dnde buscar ms informacin Formar parte de un grupo de apoyo podra resultarle til para enfrentar la ansiedad. Las siguientes fuentes pueden ayudarlo a Medical laboratory scientific officer  consejeros o grupos de apoyo cerca de su hogar:  Mental Health America (Salud Mental de los Estados Unidos): www.mentalhealthamerica.net  Anxiety and Depression Association of [place] [ADAA] (Asociacin de Ansiedad y Depresin de los Estados Unidos): ProgramCam.de  The First American on Mental Illness [NAMI] (Alianza Nacional Sobre Enfermedades Mentales): www.nami.org Comunquese con un mdico si:  Le resulta difcil permanecer concentrado o finalizar las tareas diarias.  Pasa muchas horas por da sintindose preocupado por la vida cotidiana.  La preocupacin le provoca un cansancio extremo.  Comienza a tener dolores de [place] o nuseas, o a sentirse tenso.  Orina ms de lo normal.  Tiene diarrea. Solicite ayuda inmediatamente si tiene:  Latidos cardacos acelerados y falta de aire.  Pensamientos acerca de Radiographer, therapeutic a Economist. Si alguna vez siente que puede lastimarse o Physicist, medical a Economist, o tiene pensamientos de poner fin a su vida, busque ayuda de inmediato. Puede dirigirse al servicio de emergencias ms cercano o comunicarse con:  El servicio de emergencias de su localidad (911 en EE.UU.).  Una lnea de asistencia al suicida y Visual merchandiser en crisis, como National Suicide Prevention Lifeline (Lnea Nacional de Prevencin del Suicidio), al 681-490-9347. Est disponible las 24 horas del da. Resumen  Tomar medidas para aprender y usar tcnicas de reduccin de la tensin puede ayudarlo a calmarse y a Chiropractor reaccin de ansiedad.  Cuando se usan juntos, los medicamentos, la psicoterapia y las tcnicas de reduccin de la tensin pueden ser el tratamiento ms efectivo.  Los familiares, los amigos y las parejas pueden tener un lugar importante en su recuperacin del trastorno de ansiedad. Esta informacin no tiene Theme park manager el consejo del mdico. Asegrese de hacerle al mdico cualquier pregunta que tenga. Document Revised:  02/20/2019 Document Reviewed: 02/20/2019 Elsevier Patient Education  2020 [employer]vin[employer].  Enfermedad del hgado graso Fatty Liver Disease  La enfermedad del hgado graso ocurre cuando se acumula demasiada grasa en las clulas del hgado. La enfermedad del hgado graso tambin se llama esteatosis heptica o esteatohepatitis. El hgado elimina las sustancias dainas del torrente sanguneo y produce lquidos que el cuerpo necesita. Tambin ayuda al cuerpo a Chemical engineer y Academic librarian la energa obtenida de los alimentos que come. En muchos casos, la enfermedad del hgado graso no provoca sntomas ni problemas. Con frecuencia, se diagnostica cuando se realizan estudios por otros motivos. Sin embargo, con el [place], el hgado graso puede provocar una inflamacin que posiblemente cause problemas hepticos ms graves, como la fibrosis heptica (cirrosis) o insuficiencia heptica. El hgado graso se asocia con la resistencia a la insulina, el aumento de la grasa corporal, la presin arterial alta (hipertensin) y el colesterol elevado. Estas son caractersticas del sndrome metablico y [place] el riesgo de accidente cerebrovascular, diabetes y enfermedad cardaca. Cules son las causas? Esta afeccin puede ser causada por lo siguiente:  Beber alcohol en exceso.  Mala alimentacin.  Obesidad.  Sndrome de [place].  Diabetes.  Colesterol alto.  Determinados medicamentos.  Txicos.  Algunas infecciones virales.  Embarazo. Qu incrementa el riesgo? Es ms probable que tenga esta afeccin si:  Consume alcohol en exceso.  Tiene sobrepeso.  Tiene diabetes.  Tiene hepatitis.  Tiene un nivel alto de triglicridos.  Est embarazada. Cules son los signos o los sntomas? Con frecuencia, la enfermedad del hgado graso no provoca sntomas. Si se desarrollan sntomas, estos pueden incluir:  Fatiga.  Debilidad.  Prdida de peso.  Confusin.  Dolor abdominal.  Nuseas y  vmitos.  Color amarillo en la piel y en la zona blanca de los ojos (ictericia).  Picazn en la piel. Cmo se diagnostica? Esta afeccin puede diagnosticarse mediante:  Un examen fsico y antecedentes mdicos.  Anlisis de [place].  Estudios de diagnstico por imgenes, como ecografa, exploracin por tomografa computarizada (TC) o Health visitor (RM).  Biopsia de hgado. Se extrae una pequea muestra de tejido del hgado usando [place]. La muestra se examina en el microscopio. Cmo se trata? Con frecuencia, la enfermedad del hgado graso es causada por otras afecciones. El tratamiento para el hgado graso puede incluir medicamentos y cambios en el estilo de vida para controlar enfermedades como:  Alcoholismo.  Colesterol alto.  Diabetes.  Tener exceso de [place] u obesidad. Siga estas indicaciones en su casa:   No beba alcohol. Si tiene problemas para dejar de beber, consulte al mdico cmo puede dejar de beber de forma segura con la ayuda de medicamentos o un programa con supervisin. Esto es importante para evitar que la afeccin empeore.  Siga una dieta saludable segn lo indicado por su mdico. Consulte al mdico sobre trabajar con un especialista en alimentacin y nutricin (nutricionista) a fin de Public librarian plan de alimentacin.  Haga ejercicio regularmente. Esto puede ayudarlo a [employer], y a Public house manager y la diabetes. Hable con el mdico sobre qu actividades son mejores para usted y IT trainer de ejercicios.  Tome los medicamentos de venta libre y los recetados solamente como se lo haya indicado el mdico.  Oceanographer a todas las visitas de control como se lo haya indicado el mdico. Esto es importante. Comunquese con un mdico si: Tiene dificultad para controlar lo siguiente:  Nivel de [place]er. Esto es muy importante si tiene diabetes.  El colesterol.  El consumo de alcohol. Solicite ayuda de inmediato si:  Siente  dolor abdominal.  Tiene ictericia.  Tiene nuseas y vmitos.  Vomita sangre de color rojo brillante o una sustancia similar a los granos de caf.  Las heces son negras, alquitranadas o sanguinolentas. Resumen  La enfermedad del hgado graso se desarrolla cuando se acumula demasiada grasa en las clulas del hgado.  Con frecuencia, la enfermedad del hgado no causa sntomas ni problemas. Sin embargo, con Museum/gallery conservator, el hgado graso puede provocar una inflamacin que puede causar problemas hepticos ms graves, como fibrosis heptica (cirrosis).  Es ms probable que desarrolle esta afeccin si consume alcohol en exceso, est embarazada, tiene sobrepeso, diabetes, hepatitis o altos niveles de triglicridos.  Comunquese con el mdico si tiene problemas para [employer], los niveles de azcar en la sangre, el colesterol o el consumo de alcohol. Esta informacin no tiene Theme park manager el consejo del mdico. Asegrese de hacerle al mdico cualquier pregunta que tenga. Document Revised: 08/14/2017 Document Reviewed: 08/14/2017 Elsevier Patient Education  2020 Arvin[employer]itor.  Cuidados preventivos en los hombres de 21 a 39 aos de edad Preventive Care 43-66 Years Old, Male [place] cuidados preventivos hacen referencia a las opciones en cuanto al estilo de vida y a las visitas al mdico, las cuales pueden promover la salud y Counsellor. Esto puede comprender lo siguiente:  Un examen fsico anual. Esto tambin se conoce como control de bienestar anual.  Exmenes dentales y oculares de [place] regular.  Vacunas.  Estudios para Hospital doctor.  Opciones saludables de estilo de vida, como seguir una dieta saludable, hacer ejercicio regularmente, no usar drogas ni productos que contengan nicotina y tabaco, y limitar el consumo de alcohol. Qu puedo esperar para mi visita de cuidado preventivo? Examen fsico El mdico controlar lo siguiente:  Diplomatic Services operational officer y [place]. Estos  datos se pueden usar para calcular el ndice de masa corporal (IMC), una medicin que indica si usted tiene un peso saludable.  Frecuencia cardaca y presin arterial.  Piel para detectar manchas anormales. Asesoramiento El mdico puede hacerle preguntas sobre lo siguiente:  Consumo de tabaco, alcohol y drogas.  Bienestar emocional.  Bienestar en el hogar y sus relaciones personales.  Actividad sexual.  Hbitos de alimentacin.  Trabajo y [place] laboral. Ladell Heads vacunas necesito?  [place] antigripal  Se recomienda aplicarse esta vacuna todos los [place]. Vacuna contra el ttanos, la difteria y la tos ferina (Tdap)  Es posible que tenga que aplicarse un refuerzo contra el ttanos y la difteria (DT) cada 10aos. Vacuna contra la varicela  Es posible que tenga que aplicrsela si an no la recibi. Vacuna contra el virus del papiloma humano (VPH)  Si el mdico se lo recomienda, Engineer, materials tres dosis a lo largo de 6 meses. Vacuna contra el sarampin, la rubola y las paperas (SRP)  Tal vez tenga que aplicarse por lo menos una dosis de la [place]. Tambin es posible que necesite una segunda dosis. Vacuna antimeningoccica conjugada (MenACWY)  Se recomienda la aplicacin de una dosis si tiene [employer] 19 y los 21aos de [place], y es estudiante universitario de Engineer, maintenance ao que vive en una residencia estudiantil, o si tiene una de varias afecciones mdicas. Podra tambin necesitar dosis de refuerzo. Vacuna antineumoccica conjugada (PCV13)  Puede necesitar esta vacuna si tiene determinadas enfermedades y no se vacun anteriormente. Madilyn Fireman antineumoccica de polisacridos (PPSV23)  Quizs tenga que aplicarse una o dos dosis si fuma o si tiene determinadas afecciones. Vacuna contra la hepatitis A  Es posible que necesite esta vacuna si tiene ciertas afecciones o si viaja o trabaja en lugares en los que podra estar expuesto a la hepatitis A. Vacuna contra la hepatitis B  Es  posible que necesite esta vacuna si tiene ciertas afecciones o si viaja o trabaja en lugares en los que podra estar expuesto a la hepatitis B. Vacuna antihaemophilus influenzae tipo B (Hib)  Es posible que necesite esta vacuna si tiene algunos factores de [place]. Puede recibir las vacunas en forma de dosis individuales o en forma de dos o ms vacunas juntas en la misma inyeccin (vacunas combinadas). Hable con su mdico [employer] y beneficios de las vacunas [place]. Qu pruebas necesito? Anlisis de FedEx de lpidos y colesterol. Estos se pueden verificar cada 5 aos, a partir de los [address] de [place].  Anlisis de hepatitisC.  Anlisis de hepatitisB. Pruebas de deteccin   Pruebas de deteccin de la diabetes. Esto se Physiological scientist un control del azcar en la sangre (glucosa) despus de no haber comido durante un periodo de tiempo (ayuno).  Anlisis de enfermedades de transmisin sexual (ETS). Hable con su mdico [employer], las opciones de tratamiento y, si corresponde, la necesidad de Education officer, environmental ms pruebas. Siga estas instrucciones en su casa: Comida y bebida   Siga una dieta que incluya  frutas y verduras frescas, cereales integrales, protenas magras y productos lcteos descremados.  Tome los suplementos vitamnicos y [employer] se lo haya indicado el mdico.  No beba alcohol si el mdico se lo prohbe.  Si bebe alcohol: ? Limite la cantidad que consume de 0 a 2 medidas por da. ? Est atento a la cantidad de alcohol que hay en las bebidas que toma. En los [place], una medida equivale a una botella de cerveza de 12oz ( ), un vaso de vino de 5oz ( ) o un vaso de una bebida alcohlica de alta graduacin de 1oz (19ml). Estilo de [employer] y las encas a diario.  Mantngase activo. Haga al menos de ejercicio 5o ms [employer].  No consuma ningn producto que contenga  nicotina o tabaco, como cigarrillos, cigarrillos electrnicos y tabaco de Theatre manager. Si necesita ayuda para dejar de fumar, consulte al mdico.  Si es sexualmente activo, practique sexo seguro. Use un condn u otra forma de proteccin para prevenir las ITS (infecciones de transmisin sexual). Cundo volver?  Acuda al mdico una vez al ao para una visita de control.  Pregntele al mdico con qu frecuencia debe realizarse un control de la vista y los dientes.  Mantenga su esquema de vacunacin al da. Esta informacin no tiene Theme park manager el consejo del mdico. Asegrese de hacerle al mdico cualquier pregunta que tenga. Document Revised: 09/07/2018 Document Reviewed: 09/07/2018 Elsevier Patient Education  2020 ArvinMeritor.

## 2020-08-19 ENCOUNTER — Encounter: Payer: Self-pay | Admitting: Emergency Medicine

## 2020-09-14 ENCOUNTER — Ambulatory Visit (HOSPITAL_COMMUNITY)
Admission: EM | Admit: 2020-09-14 | Discharge: 2020-09-14 | Disposition: A | Discharge status: Home / Self Care | Payer: Self-pay | Attending: Urgent Care | Admitting: Urgent Care

## 2020-09-14 ENCOUNTER — Other Ambulatory Visit: Payer: Self-pay

## 2020-09-14 ENCOUNTER — Encounter (HOSPITAL_COMMUNITY): Payer: Self-pay | Admitting: Urgent Care

## 2020-09-14 DIAGNOSIS — L255 Unspecified contact dermatitis due to plants, except food: Secondary | ICD-10-CM

## 2020-09-14 DIAGNOSIS — L259 Unspecified contact dermatitis, unspecified cause: Secondary | ICD-10-CM

## 2020-09-14 MED ORDER — HYDROXYZINE HCL 25 MG PO TABS: 12.5000 mg | ORAL_TABLET | Freq: Three times a day (TID) | ORAL | 0 refills | Status: DC | PRN

## 2020-09-14 MED ORDER — PREDNISONE 20 MG PO TABS: ORAL_TABLET | ORAL | 0 refills | Status: DC

## 2020-09-14 NOTE — ED Provider Notes (Signed)
[facility]    MRN: [id number] DOB: 1980/09/14  Subjective:   Scott Best is a 40 y.o. male presenting for 2-week history of persistent rash over his forearms and thighs, torso.  Patient states that symptoms started after he was working with poison ivy.  Has been using over-the-counter medication with minimal relief.  Denies oral or facial involvement.  No chest tightness, shortness of breath.  No current facility-administered medications for this encounter.  Current Outpatient Medications:  .  omeprazole (PRILOSEC) 20 MG capsule, Take 1 capsule (20 mg total) by mouth in the morning and at bedtime., Disp: , Rfl:    No Known Allergies  Past Medical History:  Diagnosis Date  . Allergy   . Anxiety   . Chronic headaches   . Depression   . Elevated BP without diagnosis of hypertension 06/07/2019  . Gallstones   . GERD (gastroesophageal reflux disease)   . Hyperlipidemia   . Numbness and tingling of left upper and lower extremity 06/07/2019  . RUQ abdominal pain 03/31/2020  . Sleep apnea   . Vitamin D deficiency 06/07/2019     Past Surgical History:  Procedure Laterality Date  . CHOLECYSTECTOMY    . EYE SURGERY     R eye repair nail injury  . HERNIA REPAIR      Family History  Problem Relation Age of Onset  . Diabetes Mother   . Heart disease Father   . Cancer Maternal Grandfather        "started on toe"  . Cancer Paternal Grandmother        unknown type- thinks it was in abdomen  . Stomach cancer Paternal Grandmother   . Colon cancer Neg Hx   . Esophageal cancer Neg Hx   . Rectal cancer Neg Hx     Social History   Tobacco Use  . Smoking status: Former Games developer  . Smokeless tobacco: Never Used  Vaping Use  . Vaping Use: Never used  Substance Use Topics  . Alcohol use: Yes    Alcohol/week: 7.0 standard drinks    Types: 7 Cans of beer per week  . Drug use: No    ROS   Objective:   Vitals: There were no vitals taken for this  visit.  Physical Exam Constitutional:      General: He is not in acute distress.    Appearance: Normal appearance. He is well-developed and normal weight. He is not ill-appearing, toxic-appearing or diaphoretic.  HENT:     Head: Normocephalic and atraumatic.     Right Ear: External ear normal.     Left Ear: External ear normal.     Nose: Nose normal.     Mouth/Throat:     Pharynx: Oropharynx is clear. No oropharyngeal exudate or posterior oropharyngeal erythema.  Eyes:     General: No scleral icterus.       Right eye: No discharge.        Left eye: No discharge.     Extraocular Movements: Extraocular movements intact.     Pupils: Pupils are equal, round, and reactive to light.  Cardiovascular:     Rate and Rhythm: Normal rate.  Pulmonary:     Effort: Pulmonary effort is normal.  Musculoskeletal:     Cervical back: Normal range of motion.  Skin:    Findings: Rash (Patches of erythema, excoriations worse over the forearms but also over his flank sides bilaterally) present.  Neurological:     Mental Status:  He is alert and oriented to person, place, and time.  Psychiatric:        Mood and Affect: Mood normal.        Behavior: Behavior normal.        Thought Content: Thought content normal.        Judgment: Judgment normal.     Assessment and Plan :   PDMP not reviewed this encounter.  1. Rhus dermatitis   2. Contact dermatitis, unspecified contact dermatitis type, unspecified trigger     We will use a 15-day steroid course to address contact dermatitis.  Use Vistaril for severe itching. Counseled patient on potential for adverse effects with medications prescribed/recommended today, ER and return-to-clinic precautions discussed, patient verbalized understanding.    Wallis Bamberg, PA-C 09/14/20 1348

## 2020-09-14 NOTE — ED Triage Notes (Signed)
Pt in with c/o generalized rash that occurred two weeks ago when he got into poison ivy  States the rash is all over and very itchy   Pt has been using cortisone cream with no relief

## 2020-10-08 ENCOUNTER — Ambulatory Visit (HOSPITAL_COMMUNITY)
Admission: EM | Admit: 2020-10-08 | Discharge: 2020-10-08 | Disposition: A | Discharge status: Home / Self Care | Payer: Self-pay | Attending: Urgent Care | Admitting: Urgent Care

## 2020-10-08 ENCOUNTER — Other Ambulatory Visit: Payer: Self-pay

## 2020-10-08 ENCOUNTER — Encounter (HOSPITAL_COMMUNITY): Payer: Self-pay

## 2020-10-08 DIAGNOSIS — Z87891 Personal history of nicotine dependence: Secondary | ICD-10-CM | POA: Insufficient documentation

## 2020-10-08 DIAGNOSIS — R21 Rash and other nonspecific skin eruption: Secondary | ICD-10-CM | POA: Insufficient documentation

## 2020-10-08 DIAGNOSIS — J069 Acute upper respiratory infection, unspecified: Secondary | ICD-10-CM | POA: Insufficient documentation

## 2020-10-08 DIAGNOSIS — U071 COVID-19: Secondary | ICD-10-CM | POA: Insufficient documentation

## 2020-10-08 DIAGNOSIS — R5381 Other malaise: Secondary | ICD-10-CM | POA: Insufficient documentation

## 2020-10-08 DIAGNOSIS — J029 Acute pharyngitis, unspecified: Secondary | ICD-10-CM | POA: Insufficient documentation

## 2020-10-08 DIAGNOSIS — B349 Viral infection, unspecified: Secondary | ICD-10-CM

## 2020-10-08 LAB — SARS CORONAVIRUS 2 (TAT 6-24 HRS): SARS Coronavirus 2: POSITIVE — AB

## 2020-10-08 MED ORDER — PROMETHAZINE-DM 6.25-15 MG/5ML PO SYRP: 5.0000 mL | ORAL_SOLUTION | Freq: Every evening | ORAL | 0 refills | Status: DC | PRN

## 2020-10-08 MED ORDER — BENZONATATE 100 MG PO CAPS: 100.0000 mg | ORAL_CAPSULE | Freq: Three times a day (TID) | ORAL | 0 refills | Status: DC | PRN

## 2020-10-08 MED ORDER — TRIAMCINOLONE ACETONIDE 0.1 % EX CREA: 1.0000 "application " | TOPICAL_CREAM | Freq: Two times a day (BID) | CUTANEOUS | 0 refills | Status: DC

## 2020-10-08 MED ORDER — CETIRIZINE HCL 10 MG PO TABS: 10.0000 mg | ORAL_TABLET | Freq: Every day | ORAL | 0 refills | Status: AC

## 2020-10-08 MED ORDER — PSEUDOEPHEDRINE HCL 60 MG PO TABS: 60.0000 mg | ORAL_TABLET | Freq: Three times a day (TID) | ORAL | 0 refills | Status: DC | PRN

## 2020-10-08 NOTE — ED Provider Notes (Signed)
[facility]   MRN: [id number] DOB: 01/07/1981  Subjective:   Scott Best is a 40 y.o. male presenting for 1 day history of acute onset sinus headache, sore throat, sinus congestion and a mild cough.  Patient has also had chills and body aches, malaise and fatigue.  He is COVID vaccinated but has not gotten a booster.  Denies chest pain, shortness of breath, fever.  Denies history of lung disorders.  Patient is not a smoker.  He was seen previously for a poison ivy rash which is resolved.  However in the past week he has developed a rash over the front part of his elbows.  States that it is very itchy, has been applying calamine lotion with very short-term relief.  No current facility-administered medications for this encounter.  Current Outpatient Medications:  .  acetaminophen (TYLENOL) 500 MG tablet, Take 500 mg by mouth every 6 (six) hours as needed., Disp: , Rfl:  .  hydrOXYzine (ATARAX/VISTARIL) 25 MG tablet, Take 0.5-1 tablets (12.5-25 mg total) by mouth every 8 (eight) hours as needed for itching., Disp: 30 tablet, Rfl: 0 .  omeprazole (PRILOSEC) 20 MG capsule, Take 1 capsule (20 mg total) by mouth in the morning and at bedtime., Disp: , Rfl:  .  predniSONE (DELTASONE) 20 MG tablet, Day 1-5: Take 3 pills daily. Day 6-10: Take 2 pills daily. Day 11-15: Take 1 pill daily. Take medication with breakfast., Disp: 30 tablet, Rfl: 0   No Known Allergies  Past Medical History:  Diagnosis Date  . Allergy   . Anxiety   . Chronic headaches   . Depression   . Elevated BP without diagnosis of hypertension 06/07/2019  . Gallstones   . GERD (gastroesophageal reflux disease)   . Hyperlipidemia   . Numbness and tingling of left upper and lower extremity 06/07/2019  . RUQ abdominal pain 03/31/2020  . Sleep apnea   . Vitamin D deficiency 06/07/2019     Past Surgical History:  Procedure Laterality Date  . CHOLECYSTECTOMY    . EYE SURGERY     R eye repair nail injury   . HERNIA REPAIR      Family History  Problem Relation Age of Onset  . Diabetes Mother   . Heart disease Father   . Cancer Maternal Grandfather        "started on toe"  . Cancer Paternal Grandmother        unknown type- thinks it was in abdomen  . Stomach cancer Paternal Grandmother   . Colon cancer Neg Hx   . Esophageal cancer Neg Hx   . Rectal cancer Neg Hx     Social History   Tobacco Use  . Smoking status: Former Games developer  . Smokeless tobacco: Never Used  Vaping Use  . Vaping Use: Never used  Substance Use Topics  . Alcohol use: Yes    Alcohol/week: 7.0 standard drinks    Types: 7 Cans of beer per week  . Drug use: No    ROS   Objective:   Vitals: BP 133/89 (BP Location: Left Arm)   Pulse 93   Temp 99.5 F (37.5 C) (Oral)   Resp 18   SpO2 96%   Physical Exam Constitutional:      General: He is not in acute distress.    Appearance: Normal appearance. He is well-developed and normal weight. He is not ill-appearing, toxic-appearing or diaphoretic.  HENT:     Head: Normocephalic and atraumatic.  Right Ear: Tympanic membrane, ear canal and external ear normal. There is no impacted cerumen.     Left Ear: Tympanic membrane, ear canal and external ear normal. There is no impacted cerumen.     Nose: Nose normal. No congestion or rhinorrhea.     Mouth/Throat:     Mouth: Mucous membranes are moist.     Pharynx: Oropharynx is clear. No oropharyngeal exudate or posterior oropharyngeal erythema.  Eyes:     General: No scleral icterus.       Right eye: No discharge.        Left eye: No discharge.     Extraocular Movements: Extraocular movements intact.     Conjunctiva/sclera: Conjunctivae normal.     Pupils: Pupils are equal, round, and reactive to light.  Cardiovascular:     Rate and Rhythm: Normal rate and regular rhythm.     Heart sounds: Normal heart sounds. No murmur heard. No friction rub. No gallop.   Pulmonary:     Effort: Pulmonary effort is normal.  No respiratory distress.     Breath sounds: Normal breath sounds. No stridor. No wheezing, rhonchi or rales.  Musculoskeletal:     Cervical back: Normal range of motion and neck supple. No rigidity. No muscular tenderness.  Skin:    General: Skin is warm and dry.     Findings: Rash (dry scaly sandpaper like rash over flexural surfaces of his elbows bilaterally) present.  Neurological:     General: No focal deficit present.     Mental Status: He is alert and oriented to person, place, and time.  Psychiatric:        Mood and Affect: Mood normal.        Behavior: Behavior normal.        Thought Content: Thought content normal.      Assessment and Plan :   PDMP not reviewed this encounter.  1. Viral syndrome   2. Rash and nonspecific skin eruption     Will manage for viral illness such as viral URI, viral syndrome, viral rhinitis, COVID-19. Counseled patient on nature of COVID-19 including modes of transmission, diagnostic testing, management and supportive care.  Offered scripts for symptomatic relief. COVID 19 testing is pending.  Discussed general management of eczema.  For now, will use a trial of triamcinolone cream.  Counseled patient on potential for adverse effects with medications prescribed/recommended today, ER and return-to-clinic precautions discussed, patient verbalized understanding.     Wallis Bamberg, [place] 10/08/20 [phone]

## 2020-10-08 NOTE — ED Triage Notes (Signed)
Pt presents with headache, sorethroat, chills and body aches x 1 day. Tylenol gives relief, last dose 2 hrs ago.   Pt reports rash in the left arm x 3-4 weeks due to poison ivy.

## 2020-10-08 NOTE — Discharge Instructions (Addendum)

## 2020-10-09 ENCOUNTER — Telehealth: Payer: Self-pay | Admitting: Family

## 2020-10-09 NOTE — Telephone Encounter (Signed)
Error - duplicate

## 2020-10-09 NOTE — Telephone Encounter (Signed)
Called to discuss with patient about COVID-19 symptoms and the use of one of the available treatments for those with mild to moderate Covid symptoms and at a high risk of hospitalization.  Pt appears to qualify for outpatient treatment due to co-morbid conditions and/or a member of an at-risk group in accordance with the FDA Emergency Use Authorization.    Symptom onset:  Vaccinated: Yes Booster? No Immunocompromised? No Qualifiers: BMI 34 and Elevated SVI Score  Mr. Scott Best was seen in the ED with sore throat and bodyaches on 2/10 reporting symptoms started on 2/9. He is vaccinated and not boosted. Tested positive for Covid. Mr. Scott Best primary preferred language is Spanish and a medical interpreter was used to aid in communication. Attempted to contact Mr. Scott Best via phone and the number led to a business. No VM was left. Will send MyChart message. Appears to qualify for Sotrovimab.  Marcos Eke, NP 10/09/2020 8:57 AM

## 2020-10-11 ENCOUNTER — Other Ambulatory Visit: Payer: Self-pay

## 2020-10-11 ENCOUNTER — Encounter (HOSPITAL_COMMUNITY): Payer: Self-pay | Admitting: *Deleted

## 2020-10-11 ENCOUNTER — Ambulatory Visit (HOSPITAL_COMMUNITY)
Admission: EM | Admit: 2020-10-11 | Discharge: 2020-10-11 | Disposition: A | Discharge status: Home / Self Care | Payer: HRSA Program | Attending: Student | Admitting: Student

## 2020-10-11 DIAGNOSIS — B9789 Other viral agents as the cause of diseases classified elsewhere: Secondary | ICD-10-CM | POA: Diagnosis not present

## 2020-10-11 DIAGNOSIS — J028 Acute pharyngitis due to other specified organisms: Secondary | ICD-10-CM

## 2020-10-11 DIAGNOSIS — U071 COVID-19: Secondary | ICD-10-CM | POA: Diagnosis not present

## 2020-10-11 DIAGNOSIS — I10 Essential (primary) hypertension: Secondary | ICD-10-CM

## 2020-10-11 MED ORDER — BENZONATATE 100 MG PO CAPS: 100.0000 mg | ORAL_CAPSULE | Freq: Three times a day (TID) | ORAL | 0 refills | Status: DC

## 2020-10-11 MED ORDER — PROMETHAZINE-DM 6.25-15 MG/5ML PO SYRP: 5.0000 mL | ORAL_SOLUTION | Freq: Four times a day (QID) | ORAL | 0 refills | Status: DC | PRN

## 2020-10-11 MED ORDER — LIDOCAINE VISCOUS HCL 2 % MT SOLN: 15.0000 mL | Freq: Four times a day (QID) | OROMUCOSAL | 0 refills | Status: DC | PRN

## 2020-10-11 NOTE — ED Triage Notes (Signed)
Pt tested Positive for COVID on Thursday. Pt reports sever sore throat and a dry cough.

## 2020-10-11 NOTE — Discharge Instructions (Addendum)
-  Tessalon as needed for cough. Take one pill up to 3x daily (every 8 hours) -Promethazine DM cough syrup for congestion/cough. This could make you drowsy, so take at night before bed. -For sore throat, use lidocaine mouthwash up to every 4 hours. Make sure not to eat for at least 1 hour after using this, as your mouth will be very numb and you could bite yourself. -For fevers/chills, body aches, headaches- use Tylenol and Ibuprofen. You can alternate these for maximum effect. Use up to 3000mg  Tylenol daily and 3200mg  Ibuprofen daily. Make sure to take ibuprofen with food. Check the bottle of ibuprofen/tylenol for specific dosage instructions. -make sure to isolate for at least 5 days from onset of symptoms.

## 2020-10-11 NOTE — ED Provider Notes (Addendum)
[facility]    CSN: [id number] Arrival date & time: 10/11/20  1002      History   Chief Complaint Chief Complaint  Patient presents with  . Sore Throat  . Fever    Temp at home 101    HPI Scott Best is a 40 y.o. male Pt tested Positive for COVID on Thursday. Pt reports sever sore throat and a dry cough x4 days. Fevers as high as 101, reduced by tylenol. He states he is concerned about his persistent sore throat as he has been told covid symptoms typically last 5 days only. Deniesn/v/d, shortness of breath, chest pain, facial pain, teeth pain, headaches, loss of taste/smell, swollen lymph nodes, ear pain. History allergies, anxiety, headaches, depression, elevated blood pressure, gallstones, GERD, hyperlipidemia, sleep apnea, vitamin D deficiency.  HPI  Past Medical History:  Diagnosis Date  . Allergy   . Anxiety   . Chronic headaches   . Depression   . Elevated BP without diagnosis of hypertension 06/07/2019  . Gallstones   . GERD (gastroesophageal reflux disease)   . Hyperlipidemia   . Numbness and tingling of left upper and lower extremity 06/07/2019  . RUQ abdominal pain 03/31/2020  . Sleep apnea   . Vitamin D deficiency 06/07/2019    Patient Active Problem List   Diagnosis Date Noted  . RUQ abdominal pain 03/31/2020  . Numbness and tingling of left upper and lower extremity 06/07/2019  . Elevated BP without diagnosis of hypertension 06/07/2019  . Vitamin D deficiency 06/07/2019  . Viral illness 12/02/2016  . Chills 12/02/2016  . Flu-like symptoms 12/02/2016    Past Surgical History:  Procedure Laterality Date  . CHOLECYSTECTOMY    . EYE SURGERY     R eye repair nail injury  . HERNIA REPAIR         Home Medications    Prior to Admission medications   Medication Sig Start Date End Date Taking? Authorizing Provider  benzonatate (TESSALON) 100 MG capsule Take 1 capsule (100 mg total) by mouth every 8 (eight) hours. 10/11/20  Yes Rhys Martini, PA-C  lidocaine (XYLOCAINE) 2 % solution Use as directed 15 mLs in the mouth or throat every 6 (six) hours as needed for mouth pain. 10/11/20  Yes Rhys Martini, PA-C  promethazine-dextromethorphan (PROMETHAZINE-DM) 6.25-15 MG/5ML syrup Take 5 mLs by mouth 4 (four) times daily as needed for cough. 10/11/20  Yes Rhys Martini, PA-C  acetaminophen (TYLENOL) 500 MG tablet Take 500 mg by mouth every 6 (six) hours as needed.    [provider]  cetirizine (ZYRTEC ALLERGY) 10 MG tablet Take 1 tablet (10 mg total) by mouth daily. 10/08/20   Wallis Bamberg, PA-C  hydrOXYzine (ATARAX/VISTARIL) 25 MG tablet Take 0.5-1 tablets (12.5-25 mg total) by mouth every 8 (eight) hours as needed for itching. 09/14/20   Wallis Bamberg, PA-C  omeprazole (PRILOSEC) 20 MG capsule Take 1 capsule (20 mg total) by mouth in the morning and at bedtime. 04/09/20   Arnaldo Natal, NP  predniSONE (DELTASONE) 20 MG tablet Day 1-5: Take 3 pills daily. Day 6-10: Take 2 pills daily. Day 11-15: Take 1 pill daily. Take medication with breakfast. 09/14/20   Wallis Bamberg, PA-C  pseudoephedrine (SUDAFED) 60 MG tablet Take 1 tablet (60 mg total) by mouth every 8 (eight) hours as needed for congestion. 10/08/20   Wallis Bamberg, PA-C  triamcinolone (KENALOG) 0.1 % Apply 1 application topically 2 (two) times daily. 10/08/20  Wallis Bamberg, PA-C    Family History Family History  Problem Relation Age of Onset  . Diabetes Mother   . Heart disease Father   . Cancer Maternal Grandfather        "started on toe"  . Cancer Paternal Grandmother        unknown type- thinks it was in abdomen  . Stomach cancer Paternal Grandmother   . Colon cancer Neg Hx   . Esophageal cancer Neg Hx   . Rectal cancer Neg Hx     Social History Social History   Tobacco Use  . Smoking status: Former Games developer  . Smokeless tobacco: Never Used  Vaping Use  . Vaping Use: Never used  Substance Use Topics  . Alcohol use: Yes    Alcohol/week: 7.0  standard drinks    Types: 7 Cans of beer per week  . Drug use: No     Allergies   Patient has no known allergies.   Review of Systems Review of Systems  Constitutional: Negative for appetite change, chills and fever.  HENT: Positive for congestion and sore throat. Negative for ear pain, rhinorrhea, sinus pressure and sinus pain.   Eyes: Negative for redness and visual disturbance.  Respiratory: Positive for cough. Negative for chest tightness, shortness of breath and wheezing.   Cardiovascular: Negative for chest pain and palpitations.  Gastrointestinal: Negative for abdominal pain, constipation, diarrhea, nausea and vomiting.  Genitourinary: Negative for dysuria, frequency and urgency.  Musculoskeletal: Negative for myalgias.  Neurological: Negative for dizziness, weakness and headaches.  Psychiatric/Behavioral: Negative for confusion.  All other systems reviewed and are negative.    Physical Exam Triage Vital Signs ED Triage Vitals  Enc Vitals Group     BP 10/11/20 1022 (!) 142/92     Pulse Rate 10/11/20 1022 76     Resp 10/11/20 1022 20     Temp 10/11/20 1022 98.3 F (36.8 C)     Temp Source 10/11/20 1022 Oral     SpO2 10/11/20 1022 98 %     Weight --      Height --      Head Circumference --      Peak Flow --      Pain Score 10/11/20 1024 10     Pain Loc --      Pain Edu? --      Excl. in GC? --    No data found.  Updated Vital Signs BP (!) 142/92 (BP Location: Left Arm)   Pulse 76   Temp 98.3 F (36.8 C) (Oral)   Resp 20   SpO2 98%   Visual Acuity Right Eye Distance:   Left Eye Distance:   Bilateral Distance:    Right Eye Near:   Left Eye Near:    Bilateral Near:     Physical Exam Vitals reviewed.  Constitutional:      General: He is not in acute distress.    Appearance: Normal appearance. He is not ill-appearing.  HENT:     Head: Normocephalic and atraumatic.     Comments: Smooth erythema posterior pharynx    Right Ear: Hearing, tympanic  membrane, ear canal and external ear normal. No swelling or tenderness. There is no impacted cerumen. No mastoid tenderness. Tympanic membrane is not perforated, erythematous, retracted or bulging.     Left Ear: Hearing, tympanic membrane, ear canal and external ear normal. No swelling or tenderness. There is no impacted cerumen. No mastoid tenderness. Tympanic membrane is not perforated, erythematous, retracted  or bulging.     Nose:     Right Sinus: No maxillary sinus tenderness or frontal sinus tenderness.     Left Sinus: No maxillary sinus tenderness or frontal sinus tenderness.     Mouth/Throat:     Mouth: Mucous membranes are moist.     Pharynx: Oropharynx is clear. Uvula midline. Posterior oropharyngeal erythema present. No oropharyngeal exudate or uvula swelling.     Tonsils: No tonsillar exudate or tonsillar abscesses. 0 on the right. 0 on the left.  Cardiovascular:     Rate and Rhythm: Normal rate and regular rhythm.     Heart sounds: Normal heart sounds.  Pulmonary:     Breath sounds: Normal breath sounds and air entry. No wheezing, rhonchi or rales.  Chest:     Chest wall: No tenderness.  Abdominal:     General: Abdomen is flat. Bowel sounds are normal.     Tenderness: There is no abdominal tenderness. There is no guarding or rebound.  Lymphadenopathy:     Cervical: No cervical adenopathy.  Neurological:     General: No focal deficit present.     Mental Status: He is alert and oriented to person, place, and time.  Psychiatric:        Attention and Perception: Attention and perception normal.        Mood and Affect: Mood and affect normal.        Behavior: Behavior normal. Behavior is cooperative.        Thought Content: Thought content normal.        Judgment: Judgment normal.      UC Treatments / Results  Labs (all labs ordered are listed, but only abnormal results are displayed) Labs Reviewed - No data to display  EKG   Radiology No results  found.  Procedures Procedures (including critical care time)  Medications Ordered in UC Medications - No data to display  Initial Impression / Assessment and Plan / UC Course  I have reviewed the triage vital signs and the nursing notes.  Pertinent labs & imaging results that were available during my care of the patient were reviewed by me and considered in my medical decision making (see chart for details).      For viral sore throat, reassurance provided. centor score 0, rapid strep deferred. Lidocaine mouthwash as below.   For cough, tessalon and promethazine DM as below.   Patient tested positive for covid 10/08/2020. Isolation precautions per CDC guidelines. Symptomatic relief with OTC Mucinex, Nyquil, etc. Return precautions- new/worsening fevers/chills, shortness of breath, chest pain, abd pain, etc.   For elevated blood pressure reading without diagnosis of hypertension- continue monitoring at pharmacy. Seek medical attention if continued readings >140/90.   Spent over 40 minutes obtaining H&P, performing physical, discussing results, treatment plan and plan for follow-up with patient. Patient agrees with plan.    This chart was dictated using voice recognition software, Dragon. Despite the best efforts of this provider to proofread and correct errors, errors may still occur which can change documentation meaning.   Final Clinical Impressions(s) / UC Diagnoses   Final diagnoses:  COVID-19  Viral sore throat     Discharge Instructions     -Tessalon as needed for cough. Take one pill up to 3x daily (every 8 hours) -Promethazine DM cough syrup for congestion/cough. This could make you drowsy, so take at night before bed. -For sore throat, use lidocaine mouthwash up to every 4 hours. Make sure not to eat for at  least 1 hour after using this, as your mouth will be very numb and you could bite yourself. -For fevers/chills, body aches, headaches- use Tylenol and Ibuprofen.  You can alternate these for maximum effect. Use up to 3000mg  Tylenol daily and 3200mg  Ibuprofen daily. Make sure to take ibuprofen with food. Check the bottle of ibuprofen/tylenol for specific dosage instructions. -make sure to isolate for at least 5 days from onset of symptoms.    ED Prescriptions    Medication Sig Dispense Auth. Provider   promethazine-dextromethorphan (PROMETHAZINE-DM) 6.25-15 MG/5ML syrup Take 5 mLs by mouth 4 (four) times daily as needed for cough. 118 mL , PA-C   benzonatate (TESSALON) 100 MG capsule Take 1 capsule (100 mg total) by mouth every 8 (eight) hours. 21 capsule 10-31-1990 E, PA-C   lidocaine (XYLOCAINE) 2 % solution Use as directed 15 mLs in the mouth or throat every 6 (six) hours as needed for mouth pain. 100 mL Rhys Martini, PA-C     PDMP not reviewed this encounter.   Ignacia Bayley, PA-C 10/11/20 1103    Rhys Martini, PA-C 10/11/20 1104

## 2020-12-21 ENCOUNTER — Encounter: Payer: Self-pay | Admitting: Emergency Medicine

## 2020-12-28 ENCOUNTER — Other Ambulatory Visit: Payer: Self-pay

## 2020-12-28 ENCOUNTER — Ambulatory Visit (INDEPENDENT_AMBULATORY_CARE_PROVIDER_SITE_OTHER): Payer: Self-pay | Admitting: Emergency Medicine

## 2020-12-28 ENCOUNTER — Encounter: Payer: Self-pay | Admitting: Emergency Medicine

## 2020-12-28 VITALS — BP 132/96 | HR 89 | Temp 98.8°F | Ht 67.0 in | Wt 235.2 lb

## 2020-12-28 DIAGNOSIS — K760 Fatty (change of) liver, not elsewhere classified: Secondary | ICD-10-CM | POA: Insufficient documentation

## 2020-12-28 DIAGNOSIS — Z8719 Personal history of other diseases of the digestive system: Secondary | ICD-10-CM

## 2020-12-28 NOTE — Assessment & Plan Note (Signed)
Type of foods to avoid with GERD discussed.  Advised to take over-the-counter PPIs as needed.

## 2020-12-28 NOTE — Patient Instructions (Signed)
Mantenimiento de la salud en los hombres Health Maintenance, Male Adoptar un estilo de vida saludable y recibir atencin preventiva son importantes para promover la salud y el bienestar. Consulte al mdico sobre:  El esquema adecuado para hacerse pruebas y exmenes peridicos.  Cosas que puede hacer por su cuenta para prevenir enfermedades y mantenerse sano. Qu debo saber sobre la dieta, el peso y el ejercicio? Consuma una dieta saludable  Consuma una dieta que incluya muchas verduras, frutas, productos lcteos con bajo contenido de grasa y protenas magras.  No consuma muchos alimentos ricos en grasas slidas, azcares agregados o sodio.   Mantenga un peso saludable El ndice de masa muscular (IMC) es una medida que puede utilizarse para identificar posibles problemas de peso. Proporciona una estimacin de la grasa corporal basndose en el peso y la altura. Su mdico puede ayudarle a determinar su IMC y a lograr o mantener un peso saludable. Haga ejercicio con regularidad Haga ejercicio con regularidad. Esta es una de las prcticas ms importantes que puede hacer por su salud. La mayora de los adultos deben seguir estas pautas:  Realizar, al menos, 150minutos de actividad fsica por semana. El ejercicio debe aumentar la frecuencia cardaca y hacerlo transpirar (ejercicio de intensidad moderada).  Hacer ejercicios de fortalecimiento por lo menos dos veces por semana. Agregue esto a su plan de ejercicio de intensidad moderada.  Pasar menos tiempo sentados. Incluso la actividad fsica ligera puede ser beneficiosa. Controle sus niveles de colesterol y lpidos en la sangre Comience a realizarse anlisis de lpidos y colesterol en la sangre a los 20aos y luego reptalos cada 5aos. Es posible que necesite controlar los niveles de colesterol con mayor frecuencia si:  Sus niveles de lpidos y colesterol son altos.  Es mayor de 40aos.  Presenta un alto riesgo de padecer enfermedades  cardacas. Qu debo saber sobre las pruebas de deteccin del cncer? Muchos tipos de cncer pueden detectarse de manera temprana y, a menudo, pueden prevenirse. Segn su historia clnica y sus antecedentes familiares, es posible que deba realizarse pruebas de deteccin del cncer en diferentes edades. Esto puede incluir pruebas de deteccin de lo siguiente:  Cncer colorrectal.  Cncer de prstata.  Cncer de piel.  Cncer de pulmn. Qu debo saber sobre la enfermedad cardaca, la diabetes y la hipertensin arterial? Presin arterial y enfermedad cardaca  La hipertensin arterial causa enfermedades cardacas y aumenta el riesgo de accidente cerebrovascular. Es ms probable que esto se manifieste en las personas que tienen lecturas de presin arterial alta, tienen ascendencia africana o tienen sobrepeso.  Hable con el mdico sobre sus valores de presin arterial deseados.  Hgase controlar la presin arterial: ? Cada 3 a 5 aos si tiene entre 18 y 39 aos. ? Todos los aos si es mayor de 40aos.  Si tiene entre 65 y 75 aos y es fumador o sola fumar, pregntele al mdico si debe realizarse una prueba de deteccin de aneurisma artico abdominal (AAA) por nica vez. Diabetes Realcese exmenes de deteccin de la diabetes con regularidad. Este anlisis revisa el nivel de azcar en la sangre en ayunas. Hgase las pruebas de deteccin:  Cada tresaos despus de los 45aos de edad si tiene un peso normal y un bajo riesgo de padecer diabetes.  Con ms frecuencia y a partir de una edad inferior si tiene sobrepeso o un alto riesgo de padecer diabetes. Qu debo saber sobre la prevencin de infecciones? Hepatitis B Si tiene un riesgo ms alto de contraer hepatitis B, debe   someterse a un examen de deteccin de este virus. Hable con el mdico para averiguar si tiene riesgo de contraer la infeccin por hepatitis B. Hepatitis C Se recomienda un anlisis de sangre para:  Todos los que  nacieron entre 1945 y 1965.  Todas las personas que tengan un riesgo de haber contrado hepatitis C. Enfermedades de transmisin sexual (ETS)  Debe realizarse pruebas de deteccin de ITS todos los aos, incluidas la gonorrea y la clamidia, si: ? Es sexualmente activo y es menor de 24aos. ? Es mayor de 24aos, y el mdico le informa que corre riesgo de tener este tipo de infecciones. ? La actividad sexual ha cambiado desde que le hicieron la ltima prueba de deteccin y tiene un riesgo mayor de tener clamidia o gonorrea. Pregntele al mdico si usted tiene riesgo.  Pregntele al mdico si usted tiene un alto riesgo de contraer VIH. El mdico tambin puede recomendarle un medicamento recetado para ayudar a evitar la infeccin por el VIH. Si elige tomar medicamentos para prevenir el VIH, primero debe hacerse los anlisis de deteccin del VIH. Luego debe hacerse anlisis cada 3meses mientras est tomando los medicamentos. Siga estas instrucciones en su casa: Estilo de vida  No consuma ningn producto que contenga nicotina o tabaco, como cigarrillos, cigarrillos electrnicos y tabaco de mascar. Si necesita ayuda para dejar de fumar, consulte al mdico.  No consuma drogas.  No comparta agujas.  Solicite ayuda a su mdico si necesita apoyo o informacin para abandonar las drogas. Consumo de alcohol  No beba alcohol si el mdico se lo prohbe.  Si bebe alcohol: ? Limite la cantidad que consume de 0 a 2 medidas por da. ? Est atento a la cantidad de alcohol que hay en las bebidas que toma. En los [place], una medida equivale a una botella de cerveza de 12oz (355ml), un vaso de vino de 5oz (148ml) o un vaso de una bebida alcohlica de alta graduacin de 1oz (44ml). Instrucciones generales  Realcese los estudios de rutina de la salud, dentales y de la vista.  Mantngase al da con las vacunas.  Infrmele a su mdico si: ? Se siente deprimido con frecuencia. ? Alguna vez  ha sido vctima de maltrato o no se siente seguro en su casa. Resumen  Adoptar un estilo de vida saludable y recibir atencin preventiva son importantes para promover la salud y el bienestar.  Siga las instrucciones del mdico acerca de una dieta saludable, el ejercicio y la realizacin de pruebas o exmenes para detectar enfermedades.  Siga las instrucciones del mdico con respecto al control del colesterol y la presin arterial. Esta informacin no tiene como fin reemplazar el consejo del mdico. Asegrese de hacerle al mdico cualquier pregunta que tenga. Document Revised: 09/05/2018 Document Reviewed: 09/05/2018 Elsevier Patient Education  2021 Elsevier Inc.  

## 2020-12-28 NOTE — Progress Notes (Signed)
Scott Best 40 y.o.   Chief Complaint  Patient presents with  . Transitions Of Care    HISTORY OF PRESENT ILLNESS: This is a 40 y.o. male here to transition care. Patient has a history of GERD and fatty liver. Doing well.  Has no complaints or other medical concerns.  HPI   Prior to Admission medications   Medication Sig Start Date End Date Taking? Authorizing Provider  cetirizine (ZYRTEC ALLERGY) 10 MG tablet Take 1 tablet (10 mg total) by mouth daily. 10/08/20  Yes Wallis Best, Mario, PA-C  omeprazole (PRILOSEC) 20 MG capsule Take 1 capsule (20 mg total) by mouth in the morning and at bedtime. 04/09/20  Yes Arnaldo NatalKennedy-Smith, Scott M, NP    No Known Allergies  Patient Active Problem List   Diagnosis Date Noted  . Vitamin D deficiency 06/07/2019    Past Medical History:  Diagnosis Date  . Allergy   . Anxiety   . Chronic headaches   . Depression   . Elevated BP without diagnosis of hypertension 06/07/2019  . Gallstones   . GERD (gastroesophageal reflux disease)   . Hyperlipidemia   . Numbness and tingling of left upper and lower extremity 06/07/2019  . RUQ abdominal pain 03/31/2020  . Sleep apnea   . Vitamin D deficiency 06/07/2019    Past Surgical History:  Procedure Laterality Date  . CHOLECYSTECTOMY    . EYE SURGERY     R eye repair nail injury  . HERNIA REPAIR      Social History   Socioeconomic History  . Marital status: Divorced    Spouse name: Not on file  . Number of children: Not on file  . Years of education: Not on file  . Highest education level: Not on file  Occupational History  . Occupation: Corporate investment bankerconstruction worker  Tobacco Use  . Smoking status: Former Games developermoker  . Smokeless tobacco: Never Used  Vaping Use  . Vaping Use: Never used  Substance and Sexual Activity  . Alcohol use: Yes    Alcohol/week: 4.0 standard drinks    Types: 4 Cans of beer per week  . Drug use: No  . Sexual activity: Not on file  Other Topics Concern  . Not on file   Social History Narrative   [place]ives [place] in [place]   Social Determinants of Health   Financial Resource Strain: Not on file  Food Insecurity: Not on file  Transportation Needs: Not on file  Physical Activity: Not on file  Stress: Not on file  Social Connections: Not on file  Intimate Partner Violence: Not on file    Family History  Problem Relation Age of Onset  . Diabetes Mother   . Heart disease Father   . Cancer Maternal Grandfather        "started on toe"  . Cancer Paternal Grandmother        unknown type- thinks it was in abdomen  . Stomach cancer Paternal Grandmother   . Colon cancer Neg Hx   . Esophageal cancer Neg Hx   . Rectal cancer Neg Hx      Review of Systems  Constitutional: Negative.  Negative for chills and fever.  HENT: Negative.  Negative for congestion and sore throat.   Respiratory: Negative.  Negative for cough and shortness of breath.   Cardiovascular: Negative.  Negative for chest pain and palpitations.  Gastrointestinal: Negative for abdominal pain, diarrhea, nausea and vomiting.  Genitourinary: Negative.  Negative for dysuria and hematuria.  Skin: Negative.  Neurological: Negative.  Negative for dizziness and headaches.  All other systems reviewed and are negative.    Physical Exam Vitals reviewed.  Constitutional:      Appearance: Normal appearance. He is obese.  HENT:     Head: Normocephalic.  Eyes:     Extraocular Movements: Extraocular movements intact.     Conjunctiva/sclera: Conjunctivae normal.     Pupils: Pupils are equal, round, and reactive to light.  Cardiovascular:     Rate and Rhythm: Normal rate and regular rhythm.     Pulses: Normal pulses.     Heart sounds: Normal heart sounds.  Pulmonary:     Effort: Pulmonary effort is normal.     Breath sounds: Normal breath sounds.  Abdominal:     Palpations: Abdomen is soft.     Tenderness: There is no abdominal tenderness.  Musculoskeletal:        General: Normal  range of motion.     Cervical back: Normal range of motion and neck supple.  Skin:    General: Skin is warm and dry.     Capillary Refill: Capillary refill takes less than 2 seconds.  Neurological:     General: No focal deficit present.     Mental Status: He is alert and oriented to person, place, and time.  Psychiatric:        Mood and Affect: Mood normal.        Behavior: Behavior normal.      ASSESSMENT & PLAN: Hepatic steatosis Diet and nutrition discussed.  Advised to avoid fried food and keep a low-fat diet.  Avoid alcohol and decrease amount of daily carbohydrates.  History of gastroesophageal reflux (GERD) Type of foods to avoid with GERD discussed.  Advised to take over-the-counter PPIs as needed.  Scott Best was seen today for transitions of care.  Diagnoses and all orders for this visit:  Hepatic steatosis  History of gastroesophageal reflux (GERD)    Patient Instructions   Mantenimiento de Radiographer, therapeutic en los hombres Health Maintenance, Male Adoptar un estilo de vida saludable y recibir atencin preventiva son importantes para promover la salud y Counsellor. Consulte al mdico sobre:  El esquema adecuado para hacerse pruebas y exmenes peridicos.  Cosas que puede hacer por su cuenta para prevenir enfermedades y Mucarabones sano. Qu debo saber sobre la dieta, el peso y el ejercicio? Consuma una dieta saludable  Consuma una dieta que incluya muchas verduras, frutas, productos lcteos con bajo contenido de [place] y Associate Professor.  No consuma muchos alimentos ricos en grasas slidas, azcares agregados o sodio.   Mantenga un peso saludable El ndice de masa muscular [facility]) es una medida que puede utilizarse para identificar posibles problemas de [place]. Proporciona una estimacin de la grasa corporal basndose en el peso y la altura. Su mdico puede ayudarle a Engineer, site IMC y a Personnel officer o Pharmacologist un peso saludable. Haga ejercicio con regularidad Haga ejercicio  con regularidad. Esta es una de las prcticas ms importantes que puede hacer por su salud. La mayora de los adultos deben seguir estas pautas:  Education officer, environmental, al menos, de actividad fsica por semana. El ejercicio debe aumentar la frecuencia cardaca y Media planner transpirar (ejercicio de intensidad moderada).  Hacer ejercicios de fortalecimiento por lo [employer] por semana. Agregue esto a su plan de ejercicio de intensidad moderada.  Pasar menos tiempo sentados. Incluso la actividad fsica ligera puede ser beneficiosa. Controle sus niveles de colesterol y lpidos en la sangre Comience a realizarse anlisis  de lpidos y colesterol en la sangre a los 20aos y luego reptalos cada 5aos. Es posible que Insurance underwriter los niveles de colesterol con mayor frecuencia si:  Sus niveles de lpidos y colesterol son altos.  Es mayor de 40aos.  Presenta un alto riesgo de padecer enfermedades cardacas. Qu debo saber sobre las pruebas de deteccin del cncer? Muchos tipos de cncer pueden detectarse de manera temprana y, a menudo, pueden prevenirse. Segn su historia clnica y sus antecedentes familiares, es posible que deba realizarse pruebas de deteccin del cncer en diferentes edades. Esto puede incluir pruebas de deteccin de lo siguiente:  Building services engineer.  Cncer de prstata.  Cncer de piel.  Cncer de pulmn. Qu debo saber sobre la enfermedad cardaca, la diabetes y la hipertensin arterial? Presin arterial y enfermedad cardaca  La hipertensin arterial causa enfermedades cardacas y [place] el riesgo de accidente cerebrovascular. Es ms probable que esto se manifieste en las personas que tienen lecturas de presin arterial alta, tienen ascendencia africana o tienen sobrepeso.  Hable con el mdico sobre sus valores de presin arterial deseados.  Hgase controlar la presin arterial: ? Cada 3 a 5 aos si tiene entre 18 y 61 aos. ? Todos los aos si es mayor  de [place].  Si tiene entre 65 y 66 aos y es fumador o Insurance underwriter, pregntele al mdico si debe realizarse una prueba de deteccin de aneurisma artico abdominal (AAA) por nica vez. Diabetes Realcese exmenes de deteccin de la diabetes con regularidad. Este anlisis revisa el nivel de azcar en la sangre en [place]. Hgase las pruebas de deteccin:  Cada tresaos despus de los 45aos de edad si tiene un peso normal y un bajo riesgo de padecer diabetes.  Con ms frecuencia y a partir de [place] edad inferior si tiene sobrepeso o un alto riesgo de padecer diabetes. Qu debo saber sobre la prevencin de infecciones? Hepatitis B Si tiene un riesgo ms alto de contraer hepatitis B, debe someterse a un examen de deteccin de este virus. Hable con el mdico para averiguar si tiene riesgo de contraer la infeccin por hepatitis B. Hepatitis C Se recomienda un anlisis de [place] para:  Todos los que nacieron entre 1945 y [phone].  Todas las personas que tengan un riesgo de haber contrado hepatitis C. Enfermedades de transmisin sexual (ETS)  Debe realizarse pruebas de deteccin de ITS todos los aos, incluidas la gonorrea y la clamidia, si: ? Es sexualmente activo y es menor de 24aos. ? Es mayor de 24aos, y Public affairs consultant informa que corre riesgo de tener este tipo de infecciones. ? La actividad sexual ha cambiado desde que le hicieron la ltima prueba de deteccin y tiene un riesgo mayor de Warehouse manager clamidia o Copy. Pregntele al mdico si usted tiene riesgo.  Pregntele al mdico si usted tiene un alto riesgo de Primary school teacher VIH. El mdico tambin puede recomendarle un medicamento recetado para ayudar a evitar la infeccin por el VIH. Si elige tomar medicamentos para prevenir el VIH, primero debe [employer] de deteccin del VIH. Luego debe hacerse anlisis cada mientras est tomando los medicamentos. Siga estas instrucciones en su casa: Estilo de vida  No consuma ningn  producto que contenga nicotina o tabaco, como cigarrillos, cigarrillos electrnicos y tabaco de Theatre manager. Si necesita ayuda para dejar de fumar, consulte al mdico.  No consuma drogas.  No comparta agujas.  Solicite ayuda a su mdico si necesita apoyo o informacin para abandonar las drogas. Consumo de alcohol  No beba alcohol si el mdico se lo prohbe.  Si bebe alcohol: ? Limite la cantidad que consume de 0 a 2 medidas por da. ? Est atento a la cantidad de alcohol que hay en las bebidas que toma. En los [place], una medida equivale a una botella de cerveza de 12oz ( ), un vaso de vino de 5oz ( ) o un vaso de una bebida alcohlica de alta graduacin de 1oz (21ml). Instrucciones generales  Realcese los estudios de rutina de la salud, dentales y de Wellsite geologist.  Mantngase al da con las vacunas.  Infrmele a su mdico si: ? Se siente deprimido con frecuencia. ? Alguna vez ha sido vctima de [place] o no se siente seguro en su casa. Resumen  Adoptar un estilo de vida saludable y recibir atencin preventiva son importantes para promover la salud y Counsellor.  Siga las instrucciones del mdico acerca de una dieta saludable, el ejercicio y la realizacin de pruebas o exmenes para Hotel manager.  Siga las instrucciones del mdico con respecto al control del colesterol y la presin arterial. Esta informacin no tiene Theme park manager el consejo del mdico. Asegrese de hacerle al mdico cualquier pregunta que tenga. Document Revised: 09/05/2018 Document Reviewed: 09/05/2018 Elsevier Patient Education  2021 Elsevier Inc.      Edwina Barth, MD [facility]

## 2020-12-28 NOTE — Assessment & Plan Note (Signed)
Diet and nutrition discussed.  Advised to avoid fried food and keep a low-fat diet.  Avoid alcohol and decrease amount of daily carbohydrates.

## 2021-11-25 ENCOUNTER — Ambulatory Visit (HOSPITAL_COMMUNITY)
Admission: EM | Admit: 2021-11-25 | Discharge: 2021-11-25 | Disposition: A | Discharge status: Home / Self Care | Payer: Self-pay | Attending: Family Medicine | Admitting: Family Medicine

## 2021-11-25 ENCOUNTER — Encounter (HOSPITAL_COMMUNITY): Payer: Self-pay

## 2021-11-25 DIAGNOSIS — R52 Pain, unspecified: Secondary | ICD-10-CM | POA: Insufficient documentation

## 2021-11-25 DIAGNOSIS — R5383 Other fatigue: Secondary | ICD-10-CM | POA: Insufficient documentation

## 2021-11-25 LAB — COMPREHENSIVE METABOLIC PANEL
ALT: 54 U/L — ABNORMAL HIGH (ref 0–44)
AST: 40 U/L (ref 15–41)
Albumin: 4.1 g/dL (ref 3.5–5.0)
Alkaline Phosphatase: 80 U/L (ref 38–126)
Anion gap: 8 (ref 5–15)
BUN: 14 mg/dL (ref 6–20)
CO2: 23 mmol/L (ref 22–32)
Calcium: 9.1 mg/dL (ref 8.9–10.3)
Chloride: 109 mmol/L (ref 98–111)
Creatinine, Ser: 0.91 mg/dL (ref 0.61–1.24)
GFR, Estimated: >60 mL/min (ref >60)
Glucose, Bld: 94 mg/dL (ref 70–99)
Potassium: 3.8 mmol/L (ref 3.5–5.1)
Sodium: 140 mmol/L (ref 135–145)
Total Bilirubin: 0.6 mg/dL (ref 0.3–1.2)
Total Protein: 7.2 g/dL (ref 6.5–8.1)

## 2021-11-25 LAB — CBC WITH DIFFERENTIAL/PLATELET
Abs Immature Granulocytes: 0.06 10*3/uL (ref 0.00–0.07)
Basophils Absolute: 0.1 10*3/uL (ref 0.0–0.1)
Basophils Relative: 1 %
Eosinophils Absolute: 0.2 10*3/uL (ref 0.0–0.5)
Eosinophils Relative: 2 %
HCT: 45.8 % (ref 39.0–52.0)
Hemoglobin: 15.4 g/dL (ref 13.0–17.0)
Immature Granulocytes: 1 %
Lymphocytes Relative: 32 %
Lymphs Abs: 2.9 10*3/uL (ref 0.7–4.0)
MCH: 32.1 pg (ref 26.0–34.0)
MCHC: 33.6 g/dL (ref 30.0–36.0)
MCV: 95.4 fL (ref 80.0–100.0)
Monocytes Absolute: 0.5 10*3/uL (ref 0.1–1.0)
Monocytes Relative: 5 %
Neutro Abs: 5.4 10*3/uL (ref 1.7–7.7)
Neutrophils Relative %: 59 %
Platelets: 288 10*3/uL (ref 150–400)
RBC: 4.8 MIL/uL (ref 4.22–5.81)
RDW: 12.1 % (ref 11.5–15.5)
WBC: 9.2 10*3/uL (ref 4.0–10.5)
nRBC: 0 % (ref 0.0–0.2)

## 2021-11-25 LAB — VITAMIN D 25 HYDROXY (VIT D DEFICIENCY, FRACTURES): Vit D, 25-Hydroxy: 23.33 ng/mL — ABNORMAL LOW (ref 30–100)

## 2021-11-25 LAB — POCT URINALYSIS DIPSTICK, ED / UC
Bilirubin Urine: NEGATIVE
Glucose, UA: NEGATIVE mg/dL
Hgb urine dipstick: NEGATIVE
Ketones, ur: NEGATIVE mg/dL
Nitrite: NEGATIVE
Protein, ur: NEGATIVE mg/dL
Specific Gravity, Urine: 1.015 (ref 1.005–1.030)
Urobilinogen, UA: 0.2 mg/dL (ref 0.0–1.0)
pH: 7 (ref 5.0–8.0)

## 2021-11-25 LAB — TSH: TSH: 1.902 u[IU]/mL (ref 0.350–4.500)

## 2021-11-25 NOTE — ED Triage Notes (Signed)
Pt reports body aches and fatigue x 9 days.  ?

## 2021-11-25 NOTE — Discharge Instructions (Addendum)
You were seen today for fatigue and body aches.  ?Your exam was normal today.  Your urine overall looked okay.  ?I will send the urine for further testing just to be sure.  ?I have ordered lab work for you today as a place to start.  If there is anything abnormal we will notify you.  These results will be available via my chart as well.  ?If you continue feeling poorly please make an appointment with a primary care provider at [email or web address].  ? ?

## 2021-11-25 NOTE — ED Provider Notes (Signed)
?[facility] ? ? ? ?CSN: [id number] ?Arrival date & time: 11/25/21  0805 ? ? ?  ? ?History   ?Chief Complaint ?Chief Complaint  ?Patient presents with  ? Fatigue  ? Generalized Body Aches  ? ? ?HPI ?Adolpho Meenach Robb Matar is a 41 y.o. male.  ? ?Patient is here for not feeling well.  ?Started 1 week ago.  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  That continued all week.  He also had body aches in the legs and arm.  He has normal strength.  ?No fevers/chills.  He did have a couple of days of sweating.  ?No URI symptoms like cough or congestion.  ?No sore throat.  No n/v.  ?Normal appetite.  ?Normal bms.  ?He is urinating more often than usual.  No pain.  ?He still has some body aches, although not as bad, and fatigue.  ?He is sleeping more as a result.  ?No new medications.  He did start xyzal for allergies started 4 weeks ago.  ?No rashes.  No recent bug bites.  ?He does not have a pcp.  ?No h/o diabetes, but both parents have this.  He does have a h/o low vitamin d.  ?No increased thirst.  ?No weight changes.  ? ?Past Medical History:  ?Diagnosis Date  ? Allergy   ? Anxiety   ? Chronic headaches   ? Depression   ? Elevated BP without diagnosis of hypertension 06/07/2019  ? Gallstones   ? GERD (gastroesophageal reflux disease)   ? Hyperlipidemia   ? Numbness and tingling of left upper and lower extremity 06/07/2019  ? RUQ abdominal pain 03/31/2020  ? Sleep apnea   ? Vitamin D deficiency 06/07/2019  ? ? ?Patient Active Problem List  ? Diagnosis Date Noted  ? Hepatic steatosis 12/28/2020  ? History of gastroesophageal reflux (GERD) 12/28/2020  ? Vitamin D deficiency 06/07/2019  ? ? ?Past Surgical History:  ?Procedure Laterality Date  ? CHOLECYSTECTOMY    ? EYE SURGERY    ? R eye repair nail injury  ? HERNIA REPAIR    ? ? ? ? ? ?Home Medications   ? ?Prior to Admission medications   ?Medication Sig Start Date End Date Taking? Authorizing Provider  ?cetirizine (ZYRTEC ALLERGY)  10 MG tablet Take 1 tablet (10 mg total) by mouth daily. 10/08/20   Wallis Bamberg, PA-C  ?omeprazole (PRILOSEC) 20 MG capsule Take 1 capsule (20 mg total) by mouth in the morning and at bedtime. 04/09/20   Arnaldo Natal, NP  ? ? ?Family History ?Family History  ?Problem Relation Age of Onset  ? Diabetes Mother   ? Heart disease Father   ? Cancer Maternal Grandfather   ?     "started on toe"  ? Cancer Paternal Grandmother   ?     unknown type- thinks it was in abdomen  ? Stomach cancer Paternal Grandmother   ? Colon cancer Neg Hx   ? Esophageal cancer Neg Hx   ? Rectal cancer Neg Hx   ? ? ?Social History ?Social History  ? ?Tobacco Use  ? Smoking status: Former  ? Smokeless tobacco: Never  ?Vaping Use  ? Vaping Use: Never used  ?Substance Use Topics  ? Alcohol use: Yes  ?  Alcohol/week: 4.0 standard drinks  ?  Types: 4 Cans of beer per week  ? Drug use: No  ? ? ? ?Allergies   ?Patient has no known  allergies. ? ? ?Review of Systems ?Review of Systems  ?Constitutional:  Positive for fatigue. Negative for appetite change and fever.  ?HENT: Negative.    ?Eyes: Negative.   ?Respiratory: Negative.    ?Cardiovascular: Negative.   ?Gastrointestinal: Negative.   ?Endocrine: Negative.   ?Genitourinary:  Positive for frequency. Negative for dysuria and urgency.  ?Musculoskeletal:  Positive for myalgias.  ?Skin: Negative.   ?Neurological: Negative.   ?Hematological: Negative.   ?Psychiatric/Behavioral: Negative.    ? ? ?Physical Exam ?Triage Vital Signs ?ED Triage Vitals [11/25/21 0825]  ?Enc Vitals Group  ?   BP (!) 147/108  ?   Pulse Rate 82  ?   Resp 18  ?   Temp 98.3 ?F (36.8 ?C)  ?   Temp Source Oral  ?   SpO2 100 %  ?   Weight   ?   Height   ?   Head Circumference   ?   Peak Flow   ?   Pain Score 3  ?   Pain Loc   ?   Pain Edu?   ?   Excl. in GC?   ? ?No data found. ? ?Updated Vital Signs ?BP (!) 147/108 (BP Location: Left Arm)   Pulse 82   Temp 98.3 ?F (36.8 ?C) (Oral)   Resp 18   SpO2 100%  ? ?Visual  Acuity ?Right Eye Distance:   ?Left Eye Distance:   ?Bilateral Distance:   ? ?Right Eye Near:   ?Left Eye Near:    ?Bilateral Near:    ? ?Physical Exam ?Constitutional:   ?   Appearance: Normal appearance. He is normal weight.  ?HENT:  ?   Head: Normocephalic and atraumatic.  ?   Nose: Nose normal.  ?   Mouth/Throat:  ?   Mouth: Mucous membranes are moist.  ?Eyes:  ?   Extraocular Movements: Extraocular movements intact.  ?   Pupils: Pupils are equal, round, and reactive to light.  ?Cardiovascular:  ?   Rate and Rhythm: Normal rate and regular rhythm.  ?Pulmonary:  ?   Effort: Pulmonary effort is normal.  ?   Breath sounds: Normal breath sounds.  ?Abdominal:  ?   General: Bowel sounds are normal.  ?   Palpations: Abdomen is soft.  ?Musculoskeletal:     ?   General: Normal range of motion.  ?   Cervical back: Normal range of motion and neck supple. No tenderness.  ?Lymphadenopathy:  ?   Cervical: No cervical adenopathy.  ?Skin: ?   General: Skin is warm and dry.  ?Neurological:  ?   General: No focal deficit present.  ?   Mental Status: He is alert and oriented to person, place, and time.  ?Psychiatric:     ?   Mood and Affect: Mood normal.     ?   Behavior: Behavior normal.  ? ? ? ?UC Treatments / Results  ?Labs ?(all labs ordered are listed, but only abnormal results are displayed) ?Labs Reviewed  ?POCT URINALYSIS DIPSTICK, ED / UC - Abnormal; Notable for the following components:  ?    Result Value  ? Leukocytes,Ua TRACE (*)   ? All other components within normal limits  ?URINE CULTURE  ?CBC WITH DIFFERENTIAL/PLATELET  ?COMPREHENSIVE METABOLIC PANEL  ?TSH  ?VITAMIN D 25 HYDROXY (VIT D DEFICIENCY, FRACTURES)  ? ? ?EKG ? ? ?Radiology ?No results found. ? ?Procedures ?Procedures (including critical care time) ? ?Medications Ordered in UC ?Medications - No data to  display ? ?Initial Impression / Assessment and Plan / UC Course  ?I have reviewed the triage vital signs and the nursing notes. ? ?Pertinent labs & imaging  results that were available during my care of the patient were reviewed by me and considered in my medical decision making (see chart for details). ? ?  ?Final Clinical Impressions(s) / UC Diagnoses  ? ?Final diagnoses:  ?Other fatigue  ?Body aches  ? ? ? ?Discharge Instructions   ? ?  ?You were seen today for fatigue and body aches.  ?Your exam was normal today.  Your urine overall looked okay.  ?I will send the urine for further testing just to be sure.  ?I have ordered lab work for you today as a place to start.  If there is anything abnormal we will notify you.  These results will be available via my chart as well.  ?If you continue feeling poorly please make an appointment with a primary care provider at [email or web address].  ? ? ? ? ?ED Prescriptions   ?None ?  ? ?PDMP not reviewed this encounter. ?  Jannifer Franklin?Rishit Burkhalter, MD ?11/25/21 0919 ? ?

## 2021-11-26 LAB — URINE CULTURE: Culture: NO GROWTH

## 2022-03-07 ENCOUNTER — Ambulatory Visit (HOSPITAL_COMMUNITY)
Admission: EM | Admit: 2022-03-07 | Discharge: 2022-03-07 | Disposition: A | Discharge status: Home / Self Care | Payer: Self-pay | Attending: Family Medicine | Admitting: Family Medicine

## 2022-03-07 ENCOUNTER — Encounter (HOSPITAL_COMMUNITY): Payer: Self-pay | Admitting: *Deleted

## 2022-03-07 ENCOUNTER — Other Ambulatory Visit: Payer: Self-pay

## 2022-03-07 DIAGNOSIS — S70362A Insect bite (nonvenomous), left thigh, initial encounter: Secondary | ICD-10-CM

## 2022-03-07 DIAGNOSIS — W57XXXA Bitten or stung by nonvenomous insect and other nonvenomous arthropods, initial encounter: Secondary | ICD-10-CM

## 2022-03-07 DIAGNOSIS — T7840XA Allergy, unspecified, initial encounter: Secondary | ICD-10-CM

## 2022-03-07 MED ORDER — DOXYCYCLINE HYCLATE 100 MG PO CAPS: 100.0000 mg | ORAL_CAPSULE | Freq: Two times a day (BID) | ORAL | 0 refills | Status: AC

## 2022-03-07 MED ORDER — TRIAMCINOLONE ACETONIDE 40 MG/ML IJ SUSP
40.0000 mg | Freq: Once | INTRAMUSCULAR | Status: AC
Start: 2022-03-07 — End: 2022-03-07
  Administered 2022-03-07: 40 mg via INTRAMUSCULAR

## 2022-03-07 MED ORDER — PREDNISONE 20 MG PO TABS: 40.0000 mg | ORAL_TABLET | Freq: Every day | ORAL | 0 refills | Status: AC

## 2022-03-07 MED ORDER — TRIAMCINOLONE ACETONIDE 40 MG/ML IJ SUSP
INTRAMUSCULAR | Status: AC
  Filled 2022-03-07: qty 1

## 2022-03-07 NOTE — Discharge Instructions (Addendum)
You have been given a shot of triamcinolone 40 mg  Doxycycline 100 mg--1 capsule 2 times daily for 7 days  Prednisone 20 mg--2 daily for 5 days

## 2022-03-07 NOTE — ED Triage Notes (Addendum)
New med Celecoxib started taking med on last Monday. On Wed Pt had a tick on Lt upper thigh. P thas a red mark and rash on Leg. Pt reports having diarrhea on Friday.

## 2022-03-07 NOTE — ED Provider Notes (Signed)
[facility]    CSN: [id number] Arrival date & time: 03/07/22  9292      History   Chief Complaint Chief Complaint  Patient presents with   Allergic Reaction   Tick Removal    HPI Scott Best is a 41 y.o. male.    Allergic Reaction  Here for a rash that began bothering him on July 4.  The day before he had started taking Celebrex for some knee pain.  Then on July 5 he noted the tick on his left anterior thigh that he removed.  In the ensuing days he then developed some redness around that area and felt like he had some chills and some diarrhea.  No vomiting or nausea  Past Medical History:  Diagnosis Date   Allergy    Anxiety    Chronic headaches    Depression    Elevated BP without diagnosis of hypertension 06/07/2019   Gallstones    GERD (gastroesophageal reflux disease)    Hyperlipidemia    Numbness and tingling of left upper and lower extremity 06/07/2019   RUQ abdominal pain 03/31/2020   Sleep apnea    Vitamin D deficiency 06/07/2019    Patient Active Problem List   Diagnosis Date Noted   Hepatic steatosis 12/28/2020   History of gastroesophageal reflux (GERD) 12/28/2020   Vitamin D deficiency 06/07/2019    Past Surgical History:  Procedure Laterality Date   CHOLECYSTECTOMY     EYE SURGERY     R eye repair nail injury   HERNIA REPAIR         Home Medications    Prior to Admission medications   Medication Sig Start Date End Date Taking? Authorizing Provider  doxycycline (VIBRAMYCIN) 100 MG capsule Take 1 capsule (100 mg total) by mouth 2 (two) times daily for 7 days. 03/07/22 03/14/22 Yes Taelor Waymire, Janace Aris, MD  predniSONE (DELTASONE) 20 MG tablet Take 2 tablets (40 mg total) by mouth daily with breakfast for 5 days. 03/07/22 03/12/22 Yes Graziella Connery, Janace Aris, MD  cetirizine (ZYRTEC ALLERGY) 10 MG tablet Take 1 tablet (10 mg total) by mouth daily. 10/08/20   Wallis Bamberg, PA-C  omeprazole (PRILOSEC) 20 MG capsule Take 1 capsule (20 mg total)  by mouth in the morning and at bedtime. 04/09/20   Arnaldo Natal, NP    Family History Family History  Problem Relation Age of Onset   Diabetes Mother    Heart disease Father    Cancer Maternal Grandfather        "started on toe"   Cancer Paternal Grandmother        unknown type- thinks it was in abdomen   Stomach cancer Paternal Grandmother    Colon cancer Neg Hx    Esophageal cancer Neg Hx    Rectal cancer Neg Hx     Social History Social History   Tobacco Use   Smoking status: Former   Smokeless tobacco: Never  Building services engineer Use: Never used  Substance Use Topics   Alcohol use: Yes    Alcohol/week: 4.0 standard drinks of alcohol    Types: 4 Cans of beer per week   Drug use: No     Allergies   Celebrex [celecoxib]   Review of Systems Review of Systems   Physical Exam Triage Vital Signs ED Triage Vitals  Enc Vitals Group     BP 03/07/22 1031 132/90     Pulse Rate 03/07/22 1031 75  Resp 03/07/22 1031 18     Temp 03/07/22 1031 98.5 F (36.9 C)     Temp src --      SpO2 03/07/22 1031 97 %     Weight --      Height --      Head Circumference --      Peak Flow --      Pain Score 03/07/22 1029 0     Pain Loc --      Pain Edu? --      Excl. in GC? --    No data found.  Updated Vital Signs BP 132/90   Pulse 75   Temp 98.5 F (36.9 C)   Resp 18   SpO2 97%   Visual Acuity Right Eye Distance:   Left Eye Distance:   Bilateral Distance:    Right Eye Near:   Left Eye Near:    Bilateral Near:     Physical Exam Vitals reviewed.  Constitutional:      General: He is not in acute distress.    Appearance: He is not ill-appearing, toxic-appearing or diaphoretic.  HENT:     Mouth/Throat:     Mouth: Mucous membranes are moist.  Eyes:     Extraocular Movements: Extraocular movements intact.     Conjunctiva/sclera: Conjunctivae normal.     Pupils: Pupils are equal, round, and reactive to light.  Cardiovascular:     Rate and  Rhythm: Normal rate and regular rhythm.     Heart sounds: No murmur heard. Pulmonary:     Effort: Pulmonary effort is normal.     Breath sounds: No stridor. No wheezing, rhonchi or rales.  Musculoskeletal:     Cervical back: Neck supple.  Lymphadenopathy:     Cervical: No cervical adenopathy.  Skin:    Coloration: Skin is not jaundiced or pale.     Comments: There is a maculopapular rash on his extremities and on his proximal thigh.  Also there is an erythematous area that is not indurated and it is about 1.5 cm in diameter on the anterior mid thigh, where the tick had bitten him  Neurological:     Mental Status: He is alert and oriented to person, place, and time.  Psychiatric:        Behavior: Behavior normal.      UC Treatments / Results  Labs (all labs ordered are listed, but only abnormal results are displayed) Labs Reviewed - No data to display  EKG   Radiology No results found.  Procedures Procedures (including critical care time)  Medications Ordered in UC Medications  triamcinolone acetonide (KENALOG-40) injection 40 mg (has no administration in time range)    Initial Impression / Assessment and Plan / UC Course  I have reviewed the triage vital signs and the nursing notes.  Pertinent labs & imaging results that were available during my care of the patient were reviewed by me and considered in my medical decision making (see chart for details).     I will treat for Lyme exposure and for allergic dermatitis Final Clinical Impressions(s) / UC Diagnoses   Final diagnoses:  Allergic reaction, initial encounter  Tick bite of left thigh, initial encounter   Discharge Instructions   None    ED Prescriptions     Medication Sig Dispense Auth. Provider   doxycycline (VIBRAMYCIN) 100 MG capsule Take 1 capsule (100 mg total) by mouth 2 (two) times daily for 7 days. 14 capsule Zenia Resides, MD  predniSONE (DELTASONE) 20 MG tablet Take 2 tablets (40 mg  total) by mouth daily with breakfast for 5 days. 10 tablet Marlinda Mike Janace Aris, MD      PDMP not reviewed this encounter.   Zenia Resides, MD 03/07/22 [id number]

## 2022-05-27 ENCOUNTER — Encounter (HOSPITAL_COMMUNITY): Payer: Self-pay | Admitting: Emergency Medicine

## 2022-05-27 ENCOUNTER — Ambulatory Visit (INDEPENDENT_AMBULATORY_CARE_PROVIDER_SITE_OTHER): Payer: Self-pay

## 2022-05-27 ENCOUNTER — Ambulatory Visit (HOSPITAL_COMMUNITY)
Admission: EM | Admit: 2022-05-27 | Discharge: 2022-05-27 | Disposition: A | Discharge status: Home / Self Care | Payer: Self-pay | Attending: Family Medicine | Admitting: Family Medicine

## 2022-05-27 DIAGNOSIS — R509 Fever, unspecified: Secondary | ICD-10-CM | POA: Insufficient documentation

## 2022-05-27 DIAGNOSIS — M79605 Pain in left leg: Secondary | ICD-10-CM | POA: Insufficient documentation

## 2022-05-27 DIAGNOSIS — Z20822 Contact with and (suspected) exposure to covid-19: Secondary | ICD-10-CM | POA: Insufficient documentation

## 2022-05-27 NOTE — ED Provider Notes (Signed)
[facility]    CSN: [id number] Arrival date & time: 05/27/22  1115      History   Chief Complaint Chief Complaint  Patient presents with   Leg Pain    HPI Scott Best is a 41 y.o. male.    Leg Pain  Here for left upper thigh pain and for subjective fever.  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  It has been painful and he has been taking ibuprofen which does help.  Yesterday he noted some feeling of being febrile, and was not sure if it was related to the injury or not.  He has not had any sore throat or cough or congestion.  No nausea vomiting or diarrhea.  He is allergic to Celebrex which causes a rash  Past Medical History:  Diagnosis Date   Allergy    Anxiety    Chronic headaches    Depression    Elevated BP without diagnosis of hypertension 06/07/2019   Gallstones    GERD (gastroesophageal reflux disease)    Hyperlipidemia    Numbness and tingling of left upper and lower extremity 06/07/2019   RUQ abdominal pain 03/31/2020   Sleep apnea    Vitamin D deficiency 06/07/2019    Patient Active Problem List   Diagnosis Date Noted   Hepatic steatosis 12/28/2020   History of gastroesophageal reflux (GERD) 12/28/2020   Vitamin D deficiency 06/07/2019    Past Surgical History:  Procedure Laterality Date   CHOLECYSTECTOMY     EYE SURGERY     R eye repair nail injury   HERNIA REPAIR         Home Medications    Prior to Admission medications   Medication Sig Start Date End Date Taking? Authorizing Provider  cetirizine (ZYRTEC ALLERGY) 10 MG tablet Take 1 tablet (10 mg total) by mouth daily. 10/08/20   Jaynee Eagles, PA-C  omeprazole (PRILOSEC) 20 MG capsule Take 1 capsule (20 mg total) by mouth in the morning and at bedtime. 04/09/20   Noralyn Pick, NP    Family History Family History  Problem Relation Age of Onset   Diabetes Mother    Heart disease Father    Cancer  Maternal Grandfather        "started on toe"   Cancer Paternal Grandmother        unknown type- thinks it was in abdomen   Stomach cancer Paternal Grandmother    Colon cancer Neg Hx    Esophageal cancer Neg Hx    Rectal cancer Neg Hx     Social History Social History   Tobacco Use   Smoking status: Former   Smokeless tobacco: Never  Scientific laboratory technician Use: Never used  Substance Use Topics   Alcohol use: Yes    Alcohol/week: 4.0 standard drinks of alcohol    Types: 4 Cans of beer per week   Drug use: No     Allergies   Celebrex [celecoxib]   Review of Systems Review of Systems   Physical Exam Triage Vital Signs ED Triage Vitals  Enc Vitals Group     BP 05/27/22 1232 (!) 133/93     Pulse Rate 05/27/22 1232 74     Resp 05/27/22 1232 18     Temp 05/27/22 1232 98.3 F (36.8 C)     Temp Source 05/27/22 1232 Oral     SpO2 05/27/22 1232 96 %  Weight --      Height --      Head Circumference --      Peak Flow --      Pain Score 05/27/22 1230 8     Pain Loc --      Pain Edu? --      Excl. in GC? --    No data found.  Updated Vital Signs BP (!) 133/93 (BP Location: Right Arm)   Pulse 74   Temp 98.3 F (36.8 C) (Oral)   Resp 18   SpO2 96%   Visual Acuity Right Eye Distance:   Left Eye Distance:   Bilateral Distance:    Right Eye Near:   Left Eye Near:    Bilateral Near:     Physical Exam Vitals reviewed.  Constitutional:      General: He is not in acute distress.    Appearance: He is not toxic-appearing.  HENT:     Nose: Nose normal.     Mouth/Throat:     Mouth: Mucous membranes are moist.     Pharynx: No oropharyngeal exudate or posterior oropharyngeal erythema.  Eyes:     Extraocular Movements: Extraocular movements intact.     Conjunctiva/sclera: Conjunctivae normal.     Pupils: Pupils are equal, round, and reactive to light.  Cardiovascular:     Rate and Rhythm: Normal rate and regular rhythm.     Heart sounds: No murmur  heard. Pulmonary:     Effort: Pulmonary effort is normal.     Breath sounds: No stridor. No wheezing, rhonchi or rales.  Musculoskeletal:     Cervical back: Neck supple.     Comments: There is some tenderness of the left lateral proximal thigh.  There is no obvious ecchymosis or induration or erythema at this time.  No deformity  Lymphadenopathy:     Cervical: No cervical adenopathy.  Skin:    Capillary Refill: Capillary refill takes less than 2 seconds.     Coloration: Skin is not jaundiced or pale.  Neurological:     General: No focal deficit present.     Mental Status: He is alert and oriented to person, place, and time.  Psychiatric:        Behavior: Behavior normal.      UC Treatments / Results  Labs (all labs ordered are listed, but only abnormal results are displayed) Labs Reviewed  SARS CORONAVIRUS 2 (TAT 6-24 HRS)    EKG   Radiology DG Femur 1V Left  Result Date: 05/27/2022 CLINICAL DATA:  Left upper leg pain. Piece of wood fell on leg 2 days ago. EXAM: LEFT FEMUR 1 VIEW COMPARISON:  None Available. FINDINGS: There is no evidence of fracture or other focal bone lesions. Soft tissues are unremarkable. IMPRESSION: Negative. Electronically Signed   By: Richarda Overlie M.D.   On: 05/27/2022 13:21    Procedures Procedures (including critical care time)  Medications Ordered in UC Medications - No data to display  Initial Impression / Assessment and Plan / UC Course  I have reviewed the triage vital signs and the nursing notes.  Pertinent labs & imaging results that were available during my care of the patient were reviewed by me and considered in my medical decision making (see chart for details).       We discussed options for evaluation, and he was desiring an x-ray to be done.  COVID swab done to assess the feeling of fever, so he knows if he needs to quarantine.  X-ray is negative for fracture. Final Clinical Impressions(s) / UC Diagnoses   Final diagnoses:   Left leg pain  Fever, unspecified     Discharge Instructions      Your x-ray is negative for any bony problem(la radiografia fue negativa para problema del hueso)  Ice and rest that leg.(Aplique hielo a la area herida, y descanse esa pierna 1-2 dias).  You may continue to take ibuprofen as needed for pain or fever(Ud puede seguir a tomar ibuprofen cuando necesita para dolor o fiebre)  You have been swabbed for COVID, and the test will result in the next 24 hours. Our staff will call you if positive. If the test is positive, you should quarantine for 5 days from the start of your symptoms (Hemos hecho una prueba de COVID. Si es positiva, las enfermeras le hablarian a decirle. Si es positiva, Ud debe hacer cuarentena por 5 dias del primer dia que Ud tuvo las sintomas)      ED Prescriptions   None    PDMP not reviewed this encounter.   Zenia Resides, MD 05/27/22 1330

## 2022-05-27 NOTE — ED Triage Notes (Signed)
Pt reports left upper leg pain. States a wood piece fell on his leg 2 days ago. Denies bleeding or a bruise.

## 2022-05-27 NOTE — Discharge Instructions (Addendum)
Your x-ray is negative for any bony problem(la radiografia fue negativa para problema del hueso)  Ice and rest that leg.(Aplique hielo a la area herida, y descanse esa pierna 1-2 dias).  You may continue to take ibuprofen as needed for pain or fever(Ud puede seguir a tomar ibuprofen cuando necesita para dolor o fiebre)  You have been swabbed for COVID, and the test will result in the next 24 hours. Our staff will call you if positive. If the test is positive, you should quarantine for 5 days from the start of your symptoms (Hemos hecho una prueba de COVID. Si es positiva, las enfermeras le hablarian a decirle. Si es positiva, Ud debe hacer cuarentena por 5 dias del primer dia que Ud tuvo las sintomas)

## 2022-05-28 LAB — SARS CORONAVIRUS 2 (TAT 6-24 HRS): SARS Coronavirus 2: NEGATIVE
